# Patient Record
Sex: Male | Born: 1955 | Race: White | Hispanic: No | Marital: Married | Smoking: Never smoker
Health system: Southern US, Community
[De-identification: ages and names within clinical notes are randomized; demographics above are authoritative.]

## PROBLEM LIST (undated history)

## (undated) DIAGNOSIS — K219 Gastro-esophageal reflux disease without esophagitis: Secondary | ICD-10-CM

## (undated) DIAGNOSIS — R112 Nausea with vomiting, unspecified: Secondary | ICD-10-CM

## (undated) DIAGNOSIS — Z87442 Personal history of urinary calculi: Secondary | ICD-10-CM

## (undated) DIAGNOSIS — Z86711 Personal history of pulmonary embolism: Secondary | ICD-10-CM

## (undated) DIAGNOSIS — J111 Influenza due to unidentified influenza virus with other respiratory manifestations: Secondary | ICD-10-CM

## (undated) DIAGNOSIS — Z9889 Other specified postprocedural states: Secondary | ICD-10-CM

## (undated) DIAGNOSIS — M1990 Unspecified osteoarthritis, unspecified site: Secondary | ICD-10-CM

## (undated) HISTORY — PX: BACK SURGERY: SHX140

## (undated) HISTORY — PX: JOINT REPLACEMENT: SHX530

## (undated) HISTORY — PX: OTHER SURGICAL HISTORY: SHX169

## (undated) HISTORY — PX: FRACTURE SURGERY: SHX138

## (undated) HISTORY — PX: EYE SURGERY: SHX253

## (undated) HISTORY — PX: NECK SURGERY: SHX720

## (undated) HISTORY — PX: VASECTOMY: SHX75

---

## 2017-11-04 DIAGNOSIS — I2699 Other pulmonary embolism without acute cor pulmonale: Secondary | ICD-10-CM

## 2017-11-04 HISTORY — DX: Other pulmonary embolism without acute cor pulmonale: I26.99

## 2018-06-22 ENCOUNTER — Other Ambulatory Visit: Payer: Self-pay | Admitting: Orthopedic Surgery

## 2018-06-27 ENCOUNTER — Other Ambulatory Visit: Payer: Self-pay | Admitting: Orthopedic Surgery

## 2018-06-28 NOTE — Patient Instructions (Addendum)
Mark Crawford  06/28/2018   Your procedure is scheduled on: 07-10-18  Report to [facility] Main  Entrance   Report to admitting at     0945  AM    Call this number if you have problems the morning of surgery [phone]    Remember: Do not eat food or drink liquids :After Midnight. BRUSH YOUR TEETH MORNING OF SURGERY AND RINSE YOUR MOUTH OUT, NO CHEWING GUM CANDY OR MINTS.     Take these medicines the morning of surgery with A SIP OF WATER: protonix                                You may not have any metal on your body including hair pins and              piercings  Do not wear jewelry, , lotions, powders or perfumes, deodorant                      Men may shave face and neck.   Do not bring valuables to the hospital. [facility] IS NOT             RESPONSIBLE   FOR VALUABLES.  Contacts, dentures or bridgework may not be worn into surgery.  Leave suitcase in the car. After surgery it may be brought to your room.                Please read over the following fact sheets you were given: _____________________________________________________________________          [facility] - Preparing for Surgery Before surgery, you can play an important role.  Because skin is not sterile, your skin needs to be as free of germs as possible.  You can reduce the number of germs on your skin by washing with CHG (chlorahexidine gluconate) soap before surgery.  CHG is an antiseptic cleaner which kills germs and bonds with the skin to continue killing germs even after washing. Please DO NOT use if you have an allergy to CHG or antibacterial soaps.  If your skin becomes reddened/irritated stop using the CHG and inform your nurse when you arrive at Short Stay. Do not shave (including legs and underarms) for at least 48 hours prior to the first CHG shower.  You may shave your face/neck. Please follow these instructions carefully:  1.  Shower with CHG Soap the night before surgery  and the  morning of Surgery.  2.  If you choose to wash your hair, wash your hair first as usual with your  normal  shampoo.  3.  After you shampoo, rinse your hair and body thoroughly to remove the  shampoo.                           4.  Use CHG as you would any other liquid soap.  You can apply chg directly  to the skin and wash                       Gently with a scrungie or clean washcloth.  5.  Apply the CHG Soap to your body ONLY FROM THE NECK DOWN.   Do not use on face/ open  Wound or open sores. Avoid contact with eyes, ears mouth and genitals (private parts).                       Wash face,  Genitals (private parts) with your normal soap.             6.  Wash thoroughly, paying special attention to the area where your surgery  will be performed.  7.  Thoroughly rinse your body with warm water from the neck down.  8.  DO NOT shower/wash with your normal soap after using and rinsing off  the CHG Soap.                9.  Pat yourself dry with a clean towel.            10.  Wear clean pajamas.            11.  Place clean sheets on your bed the night of your first shower and do not  sleep with pets. Day of Surgery : Do not apply any lotions/deodorants the morning of surgery.  Please wear clean clothes to the hospital/surgery center.  FAILURE TO FOLLOW THESE INSTRUCTIONS MAY RESULT IN THE CANCELLATION OF YOUR SURGERY PATIENT SIGNATURE_________________________________  NURSE SIGNATURE__________________________________  ________________________________________________________________________  WHAT IS A BLOOD TRANSFUSION? Blood Transfusion Information  A transfusion is the replacement of blood or some of its parts. Blood is made up of multiple cells which provide different functions.  Red blood cells carry oxygen and are used for blood loss replacement.  White blood cells fight against infection.  Platelets control bleeding.  Plasma helps clot  blood.  Other blood products are available for specialized needs, such as hemophilia or other clotting disorders. BEFORE THE TRANSFUSION  Who gives blood for transfusions?   Healthy volunteers who are fully evaluated to make sure their blood is safe. This is blood bank blood. Transfusion therapy is the safest it has ever been in the practice of medicine. Before blood is taken from a donor, a complete history is taken to make sure that person has no history of diseases nor engages in risky social behavior (examples are intravenous drug use or sexual activity with multiple partners). The donor's travel history is screened to minimize risk of transmitting infections, such as malaria. The donated blood is tested for signs of infectious diseases, such as HIV and hepatitis. The blood is then tested to be sure it is compatible with you in order to minimize the chance of a transfusion reaction. If you or a relative donates blood, this is often done in anticipation of surgery and is not appropriate for emergency situations. It takes many days to process the donated blood. RISKS AND COMPLICATIONS Although transfusion therapy is very safe and saves many lives, the main dangers of transfusion include:   Getting an infectious disease.  Developing a transfusion reaction. This is an allergic reaction to something in the blood you were given. Every precaution is taken to prevent this. The decision to have a blood transfusion has been considered carefully by your caregiver before blood is given. Blood is not given unless the benefits outweigh the risks. AFTER THE TRANSFUSION  Right after receiving a blood transfusion, you will usually feel much better and more energetic. This is especially true if your red blood cells have gotten low (anemic). The transfusion raises the level of the red blood cells which carry oxygen, and this usually causes an energy increase.  The  nurse administering the transfusion will monitor  you carefully for complications. HOME CARE INSTRUCTIONS  No special instructions are needed after a transfusion. You may find your energy is better. Speak with your caregiver about any limitations on activity for underlying diseases you may have. SEEK MEDICAL CARE IF:   Your condition is not improving after your transfusion.  You develop redness or irritation at the intravenous (IV) site. SEEK IMMEDIATE MEDICAL CARE IF:  Any of the following symptoms occur over the next 12 hours:  Shaking chills.  You have a temperature by mouth above 102 F (38.9 C), not controlled by medicine.  Chest, back, or muscle pain.  People around you feel you are not acting correctly or are confused.  Shortness of breath or difficulty breathing.  Dizziness and fainting.  You get a rash or develop hives.  You have a decrease in urine output.  Your urine turns a dark color or changes to pink, red, or brown. Any of the following symptoms occur over the next 10 days:  You have a temperature by mouth above 102 F (38.9 C), not controlled by medicine.  Shortness of breath.  Weakness after normal activity.  The white part of the eye turns yellow (jaundice).  You have a decrease in the amount of urine or are urinating less often.  Your urine turns a dark color or changes to pink, red, or brown. Document Released: 09/17/2000 Document Revised: 12/13/2011 Document Reviewed: 05/06/2008 ExitCare Patient Information 2014 [place].  _______________________________________________________________________  Incentive Spirometer  An incentive spirometer is a tool that can help keep your lungs clear and active. This tool measures how well you are filling your lungs with each breath. Taking long deep breaths may help reverse or decrease the chance of developing breathing (pulmonary) problems (especially infection) following:  A long period of time when you are unable to move or be active. BEFORE THE  PROCEDURE   If the spirometer includes an indicator to show your best effort, your nurse or respiratory therapist will set it to a desired goal.  If possible, sit up straight or lean slightly forward. Try not to slouch.  Hold the incentive spirometer in an upright position. INSTRUCTIONS FOR USE  1. Sit on the edge of your bed if possible, or sit up as far as you can in bed or on a chair. 2. Hold the incentive spirometer in an upright position. 3. Breathe out normally. 4. Place the mouthpiece in your mouth and seal your lips tightly around it. 5. Breathe in slowly and as deeply as possible, raising the piston or the ball toward the top of the column. 6. Hold your breath for 3-5 seconds or for as long as possible. Allow the piston or ball to fall to the bottom of the column. 7. Remove the mouthpiece from your mouth and breathe out normally. 8. Rest for a few seconds and repeat Steps 1 through 7 at least 10 times every 1-2 hours when you are awake. Take your time and take a few normal breaths between deep breaths. 9. The spirometer may include an indicator to show your best effort. Use the indicator as a goal to work toward during each repetition. 10. After each set of 10 deep breaths, practice coughing to be sure your lungs are clear. If you have an incision (the cut made at the time of surgery), support your incision when coughing by placing a pillow or rolled up towels firmly against it. Once you are able to get  out of bed, walk around indoors and cough well. You may stop using the incentive spirometer when instructed by your caregiver.  RISKS AND COMPLICATIONS  Take your time so you do not get dizzy or light-headed.  If you are in pain, you may need to take or ask for pain medication before doing incentive spirometry. It is harder to take a deep breath if you are having pain. AFTER USE  Rest and breathe slowly and easily.  It can be helpful to keep track of a log of your progress. Your  caregiver can provide you with a simple table to help with this. If you are using the spirometer at home, follow these instructions: [place] IF:   You are having difficultly using the spirometer.  You have trouble using the spirometer as often as instructed.  Your pain medication is not giving enough relief while using the spirometer.  You develop fever of 100.5 F (38.1 C) or higher. SEEK IMMEDIATE MEDICAL CARE IF:   You cough up bloody sputum that had not been present before.  You develop fever of 102 F (38.9 C) or greater.  You develop worsening pain at or near the incision site. MAKE SURE YOU:   Understand these instructions.  Will watch your condition.  Will get help right away if you are not doing well or get worse. Document Released: 01/31/2007 Document Revised: 12/13/2011 Document Reviewed: 04/03/2007 [employer] Patient Information 2014 [place], [place].   ________________________________________________________________________

## 2018-07-04 ENCOUNTER — Other Ambulatory Visit: Payer: Self-pay | Admitting: Orthopedic Surgery

## 2018-07-04 ENCOUNTER — Other Ambulatory Visit: Payer: Self-pay

## 2018-07-04 ENCOUNTER — Ambulatory Visit (HOSPITAL_COMMUNITY)
Admission: RE | Admit: 2018-07-04 | Discharge: 2018-07-04 | Disposition: A | Discharge status: Home / Self Care | Payer: Managed Care, Other (non HMO) | Source: Ambulatory Visit | Attending: Orthopedic Surgery | Admitting: Orthopedic Surgery

## 2018-07-04 ENCOUNTER — Encounter (HOSPITAL_COMMUNITY): Payer: Self-pay

## 2018-07-04 ENCOUNTER — Encounter (HOSPITAL_COMMUNITY)
Admission: RE | Admit: 2018-07-04 | Discharge: 2018-07-04 | Disposition: A | Discharge status: Home / Self Care | Payer: Managed Care, Other (non HMO) | Source: Ambulatory Visit | Attending: Orthopedic Surgery | Admitting: Orthopedic Surgery

## 2018-07-04 DIAGNOSIS — Z01818 Encounter for other preprocedural examination: Secondary | ICD-10-CM | POA: Insufficient documentation

## 2018-07-04 HISTORY — DX: Personal history of pulmonary embolism: Z86.711

## 2018-07-04 HISTORY — DX: Gastro-esophageal reflux disease without esophagitis: K21.9

## 2018-07-04 HISTORY — DX: Influenza due to unidentified influenza virus with other respiratory manifestations: J11.1

## 2018-07-04 HISTORY — DX: Unspecified osteoarthritis, unspecified site: M19.90

## 2018-07-04 HISTORY — DX: Personal history of urinary calculi: Z87.442

## 2018-07-04 LAB — CBC WITH DIFFERENTIAL/PLATELET
Basophils Absolute: 0 10*3/uL (ref 0.0–0.1)
Basophils Relative: 0 %
EOS ABS: 0.1 10*3/uL (ref 0.0–0.7)
Eosinophils Relative: 1 %
HCT: 45.6 % (ref 39.0–52.0)
HEMOGLOBIN: 14.9 g/dL (ref 13.0–17.0)
LYMPHS ABS: 1.3 10*3/uL (ref 0.7–4.0)
Lymphocytes Relative: 22 %
MCH: 30.2 pg (ref 26.0–34.0)
MCHC: 32.7 g/dL (ref 30.0–36.0)
MCV: 92.3 fL (ref 78.0–100.0)
Monocytes Absolute: 0.5 10*3/uL (ref 0.1–1.0)
Monocytes Relative: 9 %
NEUTROS ABS: 3.9 10*3/uL (ref 1.7–7.7)
NEUTROS PCT: 68 %
Platelets: 231 10*3/uL (ref 150–400)
RBC: 4.94 MIL/uL (ref 4.22–5.81)
RDW: 12.9 % (ref 11.5–15.5)
WBC: 5.7 10*3/uL (ref 4.0–10.5)

## 2018-07-04 LAB — BASIC METABOLIC PANEL
ANION GAP: 8 (ref 5–15)
BUN: 20 mg/dL (ref 8–23)
CHLORIDE: 107 mmol/L (ref 98–111)
CO2: 26 mmol/L (ref 22–32)
CREATININE: 1.19 mg/dL (ref 0.61–1.24)
Calcium: 9.7 mg/dL (ref 8.9–10.3)
GFR calc non Af Amer: >60 mL/min (ref >60)
Glucose, Bld: 105 mg/dL — ABNORMAL HIGH (ref 70–99)
Potassium: 4.7 mmol/L (ref 3.5–5.1)
SODIUM: 141 mmol/L (ref 135–145)

## 2018-07-04 LAB — URINALYSIS, ROUTINE W REFLEX MICROSCOPIC
Bilirubin Urine: NEGATIVE
Glucose, UA: NEGATIVE mg/dL
HGB URINE DIPSTICK: NEGATIVE
KETONES UR: NEGATIVE mg/dL
Leukocytes, UA: NEGATIVE
Nitrite: NEGATIVE
PH: 5 (ref 5.0–8.0)
Protein, ur: NEGATIVE mg/dL
Specific Gravity, Urine: 1.009 (ref 1.005–1.030)

## 2018-07-04 LAB — PROTIME-INR
INR: 0.9
PROTHROMBIN TIME: 12.1 s (ref 11.4–15.2)

## 2018-07-04 LAB — SURGICAL PCR SCREEN
MRSA, PCR: NEGATIVE
Staphylococcus aureus: NEGATIVE

## 2018-07-04 LAB — ABO/RH: ABO/RH(D): A POS

## 2018-07-04 LAB — APTT: aPTT: 36 seconds (ref 24–36)

## 2018-07-07 DIAGNOSIS — M87059 Idiopathic aseptic necrosis of unspecified femur: Secondary | ICD-10-CM | POA: Diagnosis present

## 2018-07-07 DIAGNOSIS — S83242A Other tear of medial meniscus, current injury, left knee, initial encounter: Secondary | ICD-10-CM | POA: Diagnosis present

## 2018-07-07 NOTE — H&P (Signed)
TOTAL HIP ADMISSION H&P  Patient is admitted for right total hip arthroplasty and left knee arthroscopy  Subjective:  Chief Complaint: right hip pain and left knee pain  HPI: Mark Crawford, 62 y.o. male, has a history of pain and functional disability in the right hip(s) due to arthritis/ AVN and patient has failed non-surgical conservative treatments for greater than 12 weeks to include NSAID's and/or analgesics, corticosteriod injections, use of assistive devices and activity modification.  Onset of symptoms was gradual starting 1 years ago with rapidlly worsening course since that time.The patient noted no past surgery on the right hip(s).  Patient currently rates pain in the right hip at 10 out of 10 with activity. Patient has night pain, worsening of pain with activity and weight bearing, pain that interfers with activities of daily living and pain with passive range of motion. Patient has evidence of joint space narrowing and AVN by imaging studies. This condition presents safety issues increasing the risk of falls.    There is no current active infection.  There are no active problems to display for this patient.  Past Medical History:  Diagnosis Date  . Arthritis   . Flu    11-2017  . GERD (gastroesophageal reflux disease)   . History of kidney stones   . History of pulmonary embolus (PE)    bil lungs 2-19      No current facility-administered medications for this encounter.    Current Outpatient Medications  Medication Sig Dispense Refill Last Dose  . cetirizine (ZYRTEC) 10 MG tablet Take 10 mg by mouth every evening.     . Melatonin 3 MG TABS Take 6 mg by mouth at bedtime.     . Multiple Vitamin (MULTIVITAMIN WITH MINERALS) TABS tablet Take 1 tablet by mouth daily.     . pantoprazole (PROTONIX) 40 MG tablet Take 40 mg by mouth daily.     Marland Kitchen pyridOXINE (VITAMIN B-6) 100 MG tablet Take 100 mg by mouth daily.     . ranitidine (ZANTAC) 150 MG capsule Take 150 mg by mouth every  evening.     . traZODone (DESYREL) 100 MG tablet Take 100 mg by mouth at bedtime.      No Known Allergies  Social History   Tobacco Use  . Smoking status: Never Smoker  . Smokeless tobacco: Never Used  Substance Use Topics  . Alcohol use: Never    Frequency: Never    No family history on file.   Review of Systems  Constitutional: Negative.   HENT: Negative.   Eyes: Positive for blurred vision.  Respiratory: Negative.   Cardiovascular: Negative.   Gastrointestinal: Negative.   Genitourinary:       Kidney stones  Musculoskeletal: Positive for joint pain and myalgias.  Skin: Negative.   Neurological: Negative.   Endo/Heme/Allergies:       Hx of blood clots  Psychiatric/Behavioral: Negative.     Objective:  Physical Exam  Constitutional: He is oriented to person, place, and time. He appears well-developed and well-nourished.  HENT:  Head: Normocephalic and atraumatic.  Eyes: Pupils are equal, round, and reactive to light.  Neck: Normal range of motion. Neck supple.  Cardiovascular: Intact distal pulses.  Respiratory: Effort normal.  Musculoskeletal: He exhibits tenderness.  The patient's right hip continues to be irritable with internal rotation.  Patient's left knee does have tenderness over the medial joint line.  Increased pain with flexion and McMurray's test.  No noticeable effusion.  No erythema or warmth.  Calves are soft and nontender.  Neurological: He is alert and oriented to person, place, and time.  Skin: Skin is warm and dry.  Psychiatric: He has a normal mood and affect. His behavior is normal. Judgment and thought content normal.    Vital signs in last 24 hours:    Labs:   Estimated body mass index is 30.08 kg/m as calculated from the following:   Height as of 07/04/18: 6\' 1"  (1.854 m).   Weight as of 07/04/18: 103.4 kg.   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    Patient's left  knee MRI does show a large medial meniscal tear.  He also has chondromalacia.   Preoperative templating of the joint replacement has been completed, documented, and submitted to the Operating Room personnel in order to optimize intra-operative equipment management.     Assessment/Plan:  End stage arthritis/ AVN, right hip(s)  And left knee medial meniscus tear  The patient history, physical examination, clinical judgement of the provider and imaging studies are consistent with end stage degenerative joint disease of the right hip(s) and total hip arthroplasty is deemed medically necessary. The treatment options including medical management, injection therapy, arthroscopy and arthroplasty were discussed at length. The risks and benefits of total hip arthroplasty were presented and reviewed. The risks due to aseptic loosening, infection, stiffness, dislocation/subluxation,  thromboembolic complications and other imponderables were discussed.  The patient acknowledged the explanation, agreed to proceed with the plan and consent was signed. Patient is being admitted for inpatient treatment for surgery, pain control, PT, OT, prophylactic antibiotics, VTE prophylaxis, progressive ambulation and ADL's and discharge planning.The patient is planning to be discharged home with home health services

## 2018-07-09 MED ORDER — TRANEXAMIC ACID 1000 MG/10ML IV SOLN
2000.0000 mg | INTRAVENOUS | Status: DC
  Filled 2018-07-09: qty 20

## 2018-07-09 MED ORDER — BUPIVACAINE LIPOSOME 1.3 % IJ SUSP
20.0000 mL | Freq: Once | INTRAMUSCULAR | Status: DC
  Filled 2018-07-09: qty 20

## 2018-07-10 ENCOUNTER — Inpatient Hospital Stay (HOSPITAL_COMMUNITY): Payer: Managed Care, Other (non HMO)

## 2018-07-10 ENCOUNTER — Encounter (HOSPITAL_COMMUNITY)
Admission: RE | Disposition: A | Discharge status: Home Health | Payer: Self-pay | Source: Ambulatory Visit | Attending: Orthopedic Surgery

## 2018-07-10 ENCOUNTER — Inpatient Hospital Stay (HOSPITAL_COMMUNITY): Payer: Managed Care, Other (non HMO) | Admitting: Anesthesiology

## 2018-07-10 ENCOUNTER — Other Ambulatory Visit: Payer: Self-pay

## 2018-07-10 ENCOUNTER — Encounter (HOSPITAL_COMMUNITY): Payer: Self-pay

## 2018-07-10 ENCOUNTER — Inpatient Hospital Stay (HOSPITAL_COMMUNITY)
Admission: RE | Admit: 2018-07-10 | Discharge: 2018-07-11 | DRG: 470 | Disposition: A | Discharge status: Home Health | Payer: Managed Care, Other (non HMO) | Source: Ambulatory Visit | Attending: Orthopedic Surgery | Admitting: Orthopedic Surgery

## 2018-07-10 DIAGNOSIS — K219 Gastro-esophageal reflux disease without esophagitis: Secondary | ICD-10-CM | POA: Diagnosis present

## 2018-07-10 DIAGNOSIS — M1611 Unilateral primary osteoarthritis, right hip: Secondary | ICD-10-CM | POA: Diagnosis present

## 2018-07-10 DIAGNOSIS — S83242A Other tear of medial meniscus, current injury, left knee, initial encounter: Secondary | ICD-10-CM | POA: Diagnosis present

## 2018-07-10 DIAGNOSIS — Z79899 Other long term (current) drug therapy: Secondary | ICD-10-CM

## 2018-07-10 DIAGNOSIS — M87851 Other osteonecrosis, right femur: Principal | ICD-10-CM | POA: Diagnosis present

## 2018-07-10 DIAGNOSIS — D62 Acute posthemorrhagic anemia: Secondary | ICD-10-CM | POA: Diagnosis not present

## 2018-07-10 DIAGNOSIS — Z86711 Personal history of pulmonary embolism: Secondary | ICD-10-CM | POA: Diagnosis not present

## 2018-07-10 DIAGNOSIS — Z86718 Personal history of other venous thrombosis and embolism: Secondary | ICD-10-CM | POA: Diagnosis not present

## 2018-07-10 DIAGNOSIS — Z419 Encounter for procedure for purposes other than remedying health state, unspecified: Secondary | ICD-10-CM

## 2018-07-10 DIAGNOSIS — M87059 Idiopathic aseptic necrosis of unspecified femur: Secondary | ICD-10-CM | POA: Diagnosis present

## 2018-07-10 HISTORY — PX: TOTAL HIP ARTHROPLASTY: SHX124

## 2018-07-10 HISTORY — PX: KNEE ARTHROSCOPY: SHX127

## 2018-07-10 LAB — TYPE AND SCREEN
ABO/RH(D): A POS
Antibody Screen: NEGATIVE

## 2018-07-10 SURGERY — ARTHROPLASTY, HIP, TOTAL, ANTERIOR APPROACH
Anesthesia: Spinal | Site: Hip | Laterality: Right

## 2018-07-10 MED ORDER — MENTHOL 3 MG MT LOZG: 1.0000 | LOZENGE | OROMUCOSAL | Status: DC | PRN

## 2018-07-10 MED ORDER — DEXAMETHASONE SODIUM PHOSPHATE 10 MG/ML IJ SOLN
INTRAMUSCULAR | Status: AC
  Filled 2018-07-10: qty 1

## 2018-07-10 MED ORDER — GABAPENTIN 300 MG PO CAPS
300.0000 mg | ORAL_CAPSULE | Freq: Three times a day (TID) | ORAL | Status: DC
  Administered 2018-07-10 – 2018-07-11 (×3): 300 mg via ORAL
  Filled 2018-07-10 (×3): qty 1

## 2018-07-10 MED ORDER — TRANEXAMIC ACID 1000 MG/10ML IV SOLN
1000.0000 mg | INTRAVENOUS | Status: AC
  Administered 2018-07-10: 1000 mg via INTRAVENOUS
  Filled 2018-07-10: qty 1000

## 2018-07-10 MED ORDER — FAMOTIDINE 20 MG PO TABS
20.0000 mg | ORAL_TABLET | Freq: Two times a day (BID) | ORAL | Status: DC
  Administered 2018-07-10 – 2018-07-11 (×2): 20 mg via ORAL
  Filled 2018-07-10 (×2): qty 1

## 2018-07-10 MED ORDER — BUPIVACAINE HCL (PF) 0.5 % IJ SOLN
INTRAMUSCULAR | Status: AC
  Filled 2018-07-10: qty 30

## 2018-07-10 MED ORDER — TRANEXAMIC ACID 1000 MG/10ML IV SOLN
INTRAVENOUS | Status: DC | PRN
  Administered 2018-07-10: 2000 mg via TOPICAL

## 2018-07-10 MED ORDER — CEFAZOLIN SODIUM-DEXTROSE 2-4 GM/100ML-% IV SOLN
2.0000 g | INTRAVENOUS | Status: AC
  Administered 2018-07-10: 2 g via INTRAVENOUS
  Filled 2018-07-10: qty 100

## 2018-07-10 MED ORDER — ACETAMINOPHEN 500 MG PO TABS
1000.0000 mg | ORAL_TABLET | Freq: Four times a day (QID) | ORAL | Status: AC
  Administered 2018-07-10 – 2018-07-11 (×4): 1000 mg via ORAL
  Filled 2018-07-10 (×4): qty 2

## 2018-07-10 MED ORDER — ACETAMINOPHEN 325 MG PO TABS: 325.0000 mg | ORAL_TABLET | Freq: Four times a day (QID) | ORAL | Status: DC | PRN

## 2018-07-10 MED ORDER — METOCLOPRAMIDE HCL 5 MG PO TABS: 5.0000 mg | ORAL_TABLET | Freq: Three times a day (TID) | ORAL | Status: DC | PRN

## 2018-07-10 MED ORDER — HYDROMORPHONE HCL 1 MG/ML IJ SOLN
0.2500 mg | INTRAMUSCULAR | Status: DC | PRN
  Administered 2018-07-10 (×2): 0.25 mg via INTRAVENOUS

## 2018-07-10 MED ORDER — DEXAMETHASONE SODIUM PHOSPHATE 10 MG/ML IJ SOLN
INTRAMUSCULAR | Status: DC | PRN
  Administered 2018-07-10: 10 mg via INTRAVENOUS

## 2018-07-10 MED ORDER — MIDAZOLAM HCL 2 MG/2ML IJ SOLN
INTRAMUSCULAR | Status: AC
  Filled 2018-07-10: qty 2

## 2018-07-10 MED ORDER — ALUM & MAG HYDROXIDE-SIMETH 200-200-20 MG/5ML PO SUSP: 30.0000 mL | ORAL | Status: DC | PRN

## 2018-07-10 MED ORDER — DOCUSATE SODIUM 100 MG PO CAPS
100.0000 mg | ORAL_CAPSULE | Freq: Two times a day (BID) | ORAL | Status: DC
  Administered 2018-07-10 – 2018-07-11 (×2): 100 mg via ORAL
  Filled 2018-07-10 (×2): qty 1

## 2018-07-10 MED ORDER — LACTATED RINGERS IV SOLN
INTRAVENOUS | Status: DC
  Administered 2018-07-10: 1000 mL via INTRAVENOUS
  Administered 2018-07-10: 13:00:00 via INTRAVENOUS

## 2018-07-10 MED ORDER — ONDANSETRON HCL 4 MG/2ML IJ SOLN
INTRAMUSCULAR | Status: AC
  Filled 2018-07-10: qty 2

## 2018-07-10 MED ORDER — LIDOCAINE HCL (CARDIAC) PF 100 MG/5ML IV SOSY
PREFILLED_SYRINGE | INTRAVENOUS | Status: AC
  Filled 2018-07-10: qty 5

## 2018-07-10 MED ORDER — PHENOL 1.4 % MT LIQD
1.0000 | OROMUCOSAL | Status: DC | PRN
  Filled 2018-07-10: qty 177

## 2018-07-10 MED ORDER — TRANEXAMIC ACID 1000 MG/10ML IV SOLN: 1000.0000 mg | Freq: Once | INTRAVENOUS | Status: DC

## 2018-07-10 MED ORDER — POLYETHYLENE GLYCOL 3350 17 G PO PACK: 17.0000 g | PACK | Freq: Every day | ORAL | Status: DC | PRN

## 2018-07-10 MED ORDER — METHOCARBAMOL 500 MG IVPB - SIMPLE MED
INTRAVENOUS | Status: AC
  Filled 2018-07-10: qty 50

## 2018-07-10 MED ORDER — LORATADINE 10 MG PO TABS
10.0000 mg | ORAL_TABLET | Freq: Every day | ORAL | Status: DC
  Administered 2018-07-10 – 2018-07-11 (×2): 10 mg via ORAL
  Filled 2018-07-10 (×2): qty 1

## 2018-07-10 MED ORDER — TRAZODONE HCL 100 MG PO TABS
100.0000 mg | ORAL_TABLET | Freq: Every day | ORAL | Status: DC
  Administered 2018-07-10: 100 mg via ORAL
  Filled 2018-07-10: qty 1

## 2018-07-10 MED ORDER — PROMETHAZINE HCL 25 MG/ML IJ SOLN: 6.2500 mg | INTRAMUSCULAR | Status: DC | PRN

## 2018-07-10 MED ORDER — FENTANYL CITRATE (PF) 100 MCG/2ML IJ SOLN
INTRAMUSCULAR | Status: AC
  Filled 2018-07-10: qty 2

## 2018-07-10 MED ORDER — APIXABAN 2.5 MG PO TABS: 2.5000 mg | ORAL_TABLET | Freq: Two times a day (BID) | ORAL | 0 refills | Status: DC

## 2018-07-10 MED ORDER — FENTANYL CITRATE (PF) 100 MCG/2ML IJ SOLN
INTRAMUSCULAR | Status: DC | PRN
  Administered 2018-07-10: 100 ug via INTRAVENOUS

## 2018-07-10 MED ORDER — TRANEXAMIC ACID 1000 MG/10ML IV SOLN
1000.0000 mg | INTRAVENOUS | Status: AC
  Administered 2018-07-10: 1000 mg via INTRAVENOUS
  Filled 2018-07-10: qty 10

## 2018-07-10 MED ORDER — BUPIVACAINE HCL (PF) 0.25 % IJ SOLN
INTRAMUSCULAR | Status: AC
  Filled 2018-07-10: qty 30

## 2018-07-10 MED ORDER — DEXAMETHASONE SODIUM PHOSPHATE 10 MG/ML IJ SOLN
10.0000 mg | Freq: Once | INTRAMUSCULAR | Status: AC
  Administered 2018-07-11: 10 mg via INTRAVENOUS
  Filled 2018-07-10: qty 1

## 2018-07-10 MED ORDER — CELECOXIB 200 MG PO CAPS
200.0000 mg | ORAL_CAPSULE | Freq: Two times a day (BID) | ORAL | Status: DC
  Administered 2018-07-10 – 2018-07-11 (×2): 200 mg via ORAL
  Filled 2018-07-10 (×2): qty 1

## 2018-07-10 MED ORDER — MEPERIDINE HCL 50 MG/ML IJ SOLN: 6.2500 mg | INTRAMUSCULAR | Status: DC | PRN

## 2018-07-10 MED ORDER — LACTATED RINGERS IR SOLN
Status: DC | PRN
  Administered 2018-07-10: 6000 mL

## 2018-07-10 MED ORDER — TRANEXAMIC ACID 1000 MG/10ML IV SOLN
1000.0000 mg | Freq: Once | INTRAVENOUS | Status: DC
  Filled 2018-07-10: qty 10

## 2018-07-10 MED ORDER — ONDANSETRON HCL 4 MG PO TABS: 4.0000 mg | ORAL_TABLET | Freq: Four times a day (QID) | ORAL | Status: DC | PRN

## 2018-07-10 MED ORDER — SODIUM CHLORIDE 0.9% FLUSH
INTRAVENOUS | Status: DC | PRN
  Administered 2018-07-10: 50 mL

## 2018-07-10 MED ORDER — PANTOPRAZOLE SODIUM 40 MG PO TBEC
40.0000 mg | DELAYED_RELEASE_TABLET | Freq: Every day | ORAL | Status: DC
  Administered 2018-07-10 – 2018-07-11 (×2): 40 mg via ORAL
  Filled 2018-07-10 (×2): qty 1

## 2018-07-10 MED ORDER — KCL IN DEXTROSE-NACL 20-5-0.45 MEQ/L-%-% IV SOLN
INTRAVENOUS | Status: DC
  Administered 2018-07-10 – 2018-07-11 (×2): via INTRAVENOUS
  Filled 2018-07-10 (×3): qty 1000

## 2018-07-10 MED ORDER — BUPIVACAINE HCL (PF) 0.5 % IJ SOLN
INTRAMUSCULAR | Status: DC | PRN
  Administered 2018-07-10: 3 mL via INTRATHECAL

## 2018-07-10 MED ORDER — TIZANIDINE HCL 2 MG PO TABS: 2.0000 mg | ORAL_TABLET | Freq: Four times a day (QID) | ORAL | 0 refills | Status: DC | PRN

## 2018-07-10 MED ORDER — METOCLOPRAMIDE HCL 5 MG/ML IJ SOLN: 5.0000 mg | Freq: Three times a day (TID) | INTRAMUSCULAR | Status: DC | PRN

## 2018-07-10 MED ORDER — MIDAZOLAM HCL 2 MG/2ML IJ SOLN
INTRAMUSCULAR | Status: DC | PRN
  Administered 2018-07-10 (×2): 0.5 mg via INTRAVENOUS
  Administered 2018-07-10: 2 mg via INTRAVENOUS
  Administered 2018-07-10 (×2): 0.5 mg via INTRAVENOUS

## 2018-07-10 MED ORDER — PROPOFOL 500 MG/50ML IV EMUL
INTRAVENOUS | Status: DC | PRN
  Administered 2018-07-10: 80 ug/kg/min via INTRAVENOUS

## 2018-07-10 MED ORDER — APIXABAN 2.5 MG PO TABS
2.5000 mg | ORAL_TABLET | Freq: Two times a day (BID) | ORAL | Status: DC
  Administered 2018-07-11: 2.5 mg via ORAL
  Filled 2018-07-10: qty 1

## 2018-07-10 MED ORDER — PROPOFOL 10 MG/ML IV BOLUS
INTRAVENOUS | Status: AC
  Filled 2018-07-10: qty 20

## 2018-07-10 MED ORDER — PROPOFOL 10 MG/ML IV BOLUS
INTRAVENOUS | Status: AC
  Filled 2018-07-10: qty 60

## 2018-07-10 MED ORDER — ONDANSETRON HCL 4 MG/2ML IJ SOLN: 4.0000 mg | Freq: Four times a day (QID) | INTRAMUSCULAR | Status: DC | PRN

## 2018-07-10 MED ORDER — BUPIVACAINE LIPOSOME 1.3 % IJ SUSP
INTRAMUSCULAR | Status: DC | PRN
  Administered 2018-07-10: 20 mL
  Administered 2018-07-10: 15 mL

## 2018-07-10 MED ORDER — BISACODYL 5 MG PO TBEC: 5.0000 mg | DELAYED_RELEASE_TABLET | Freq: Every day | ORAL | Status: DC | PRN

## 2018-07-10 MED ORDER — SODIUM CHLORIDE 0.9 % IJ SOLN
INTRAMUSCULAR | Status: AC
  Filled 2018-07-10: qty 50

## 2018-07-10 MED ORDER — HYDROMORPHONE HCL 1 MG/ML IJ SOLN: 0.5000 mg | INTRAMUSCULAR | Status: DC | PRN

## 2018-07-10 MED ORDER — HYDROMORPHONE HCL 1 MG/ML IJ SOLN
INTRAMUSCULAR | Status: AC
  Administered 2018-07-10: 0.25 mg via INTRAVENOUS
  Filled 2018-07-10: qty 1

## 2018-07-10 MED ORDER — LACTATED RINGERS IV SOLN: INTRAVENOUS | Status: DC

## 2018-07-10 MED ORDER — 0.9 % SODIUM CHLORIDE (POUR BTL) OPTIME
TOPICAL | Status: DC | PRN
  Administered 2018-07-10: 1000 mL

## 2018-07-10 MED ORDER — CHLORHEXIDINE GLUCONATE 4 % EX LIQD: 60.0000 mL | Freq: Once | CUTANEOUS | Status: DC

## 2018-07-10 MED ORDER — ONDANSETRON HCL 4 MG/2ML IJ SOLN
INTRAMUSCULAR | Status: DC | PRN
  Administered 2018-07-10: 4 mg via INTRAVENOUS

## 2018-07-10 MED ORDER — DIPHENHYDRAMINE HCL 12.5 MG/5ML PO ELIX: 12.5000 mg | ORAL_SOLUTION | ORAL | Status: DC | PRN

## 2018-07-10 MED ORDER — OXYCODONE-ACETAMINOPHEN 5-325 MG PO TABS: 1.0000 | ORAL_TABLET | ORAL | 0 refills | Status: DC | PRN

## 2018-07-10 MED ORDER — FLEET ENEMA 7-19 GM/118ML RE ENEM: 1.0000 | ENEMA | Freq: Once | RECTAL | Status: DC | PRN

## 2018-07-10 MED ORDER — METHOCARBAMOL 500 MG PO TABS
500.0000 mg | ORAL_TABLET | Freq: Four times a day (QID) | ORAL | Status: DC | PRN
  Administered 2018-07-10: 500 mg via ORAL
  Filled 2018-07-10: qty 1

## 2018-07-10 MED ORDER — OXYCODONE HCL 5 MG PO TABS
5.0000 mg | ORAL_TABLET | ORAL | Status: DC | PRN
  Administered 2018-07-10: 10 mg via ORAL
  Administered 2018-07-10 (×2): 5 mg via ORAL
  Administered 2018-07-11 (×3): 10 mg via ORAL
  Filled 2018-07-10 (×2): qty 1
  Filled 2018-07-10 (×4): qty 2

## 2018-07-10 MED ORDER — LIDOCAINE 2% (20 MG/ML) 5 ML SYRINGE
INTRAMUSCULAR | Status: DC | PRN
  Administered 2018-07-10: 50 mg via INTRAVENOUS

## 2018-07-10 MED ORDER — METHOCARBAMOL 500 MG IVPB - SIMPLE MED
500.0000 mg | Freq: Four times a day (QID) | INTRAVENOUS | Status: DC | PRN
  Administered 2018-07-10: 500 mg via INTRAVENOUS
  Filled 2018-07-10: qty 50

## 2018-07-10 SURGICAL SUPPLY — 74 items
BAG DECANTER FOR FLEXI CONT (MISCELLANEOUS) ×3 IMPLANT
BAG ZIPLOCK 12X15 (MISCELLANEOUS) ×3 IMPLANT
BANDAGE ACE 6X5 VEL STRL LF (GAUZE/BANDAGES/DRESSINGS) ×6 IMPLANT
BLADE CUTTER GATOR 3.5 (BLADE) ×3 IMPLANT
BLADE GREAT WHITE 4.2 (BLADE) ×3 IMPLANT
BLADE SAW SGTL 18X1.27X75 (BLADE) ×3 IMPLANT
BLADE SURG SZ11 CARB STEEL (BLADE) IMPLANT
BUR OVAL 6.0 (BURR) ×3 IMPLANT
CABLE CERLAGE W/CRIMP 1.8 (Cable) ×3 IMPLANT
COVER PERINEAL POST (MISCELLANEOUS) ×3 IMPLANT
COVER SURGICAL LIGHT HANDLE (MISCELLANEOUS) ×3 IMPLANT
CUFF TOURN SGL QUICK 34 (TOURNIQUET CUFF) ×1
CUFF TRNQT CYL 34X4X40X1 (TOURNIQUET CUFF) ×2 IMPLANT
CUP ACETBLR 54 OD 100 SERIES (Hips) ×3 IMPLANT
DECANTER SPIKE VIAL GLASS SM (MISCELLANEOUS) ×6 IMPLANT
DRAPE SHEET LG 3/4 BI-LAMINATE (DRAPES) ×3 IMPLANT
DRAPE STERI IOBAN 125X83 (DRAPES) ×3 IMPLANT
DRAPE U-SHAPE 47X51 STRL (DRAPES) ×6 IMPLANT
DRSG AQUACEL AG ADV 3.5X 6 (GAUZE/BANDAGES/DRESSINGS) ×3 IMPLANT
DRSG AQUACEL AG ADV 3.5X10 (GAUZE/BANDAGES/DRESSINGS) IMPLANT
DRSG PAD ABDOMINAL 8X10 ST (GAUZE/BANDAGES/DRESSINGS) ×3 IMPLANT
DURAPREP 26ML APPLICATOR (WOUND CARE) ×3 IMPLANT
ELECT PENCIL ROCKER SW 15FT (MISCELLANEOUS) ×3 IMPLANT
ELECT REM PT RETURN 15FT ADLT (MISCELLANEOUS) ×3 IMPLANT
ELIMINATOR HOLE APEX DEPUY (Hips) ×3 IMPLANT
GAUZE SPONGE 4X4 12PLY STRL (GAUZE/BANDAGES/DRESSINGS) ×6 IMPLANT
GAUZE XEROFORM 1X8 LF (GAUZE/BANDAGES/DRESSINGS) ×6 IMPLANT
GLOVE BIO SURGEON STRL SZ7 (GLOVE) ×3 IMPLANT
GLOVE BIO SURGEON STRL SZ7.5 (GLOVE) ×3 IMPLANT
GLOVE BIO SURGEON STRL SZ8.5 (GLOVE) ×3 IMPLANT
GLOVE BIOGEL PI IND STRL 7.0 (GLOVE) ×2 IMPLANT
GLOVE BIOGEL PI IND STRL 8 (GLOVE) ×2 IMPLANT
GLOVE BIOGEL PI IND STRL 9 (GLOVE) ×2 IMPLANT
GLOVE BIOGEL PI INDICATOR 7.0 (GLOVE) ×1
GLOVE BIOGEL PI INDICATOR 8 (GLOVE) ×1
GLOVE BIOGEL PI INDICATOR 9 (GLOVE) ×1
GOWN STRL REUS W/ TWL LRG LVL3 (GOWN DISPOSABLE) ×2 IMPLANT
GOWN STRL REUS W/ TWL XL LVL3 (GOWN DISPOSABLE) ×2 IMPLANT
GOWN STRL REUS W/TWL LRG LVL3 (GOWN DISPOSABLE) ×1
GOWN STRL REUS W/TWL XL LVL3 (GOWN DISPOSABLE) ×1
HEAD CERAMIC DELTA 36 PLUS 1.5 (Hips) ×3 IMPLANT
KIT BASIN OR (CUSTOM PROCEDURE TRAY) IMPLANT
LINER NEUTRAL 54X36MM PLUS 4 (Hips) ×3 IMPLANT
MANIFOLD NEPTUNE II (INSTRUMENTS) ×3 IMPLANT
NDL SAFETY ECLIPSE 18X1.5 (NEEDLE) ×2 IMPLANT
NEEDLE HYPO 18GX1.5 SHARP (NEEDLE) ×1
NEEDLE HYPO 22GX1.5 SAFETY (NEEDLE) ×6 IMPLANT
NEEDLE SPNL 18GX3.5 QUINCKE PK (NEEDLE) ×3 IMPLANT
PACK ANTERIOR HIP CUSTOM (KITS) ×3 IMPLANT
PACK ARTHROSCOPY WL (CUSTOM PROCEDURE TRAY) ×3 IMPLANT
PADDING CAST COTTON 6X4 STRL (CAST SUPPLIES) ×3 IMPLANT
PADDING CAST SYN 6 (CAST SUPPLIES) ×1
PADDING CAST SYNTHETIC 6X4 NS (CAST SUPPLIES) ×2 IMPLANT
POSITIONER SURGICAL ARM (MISCELLANEOUS) ×3 IMPLANT
SPONGE LAP 18X18 RF (DISPOSABLE) ×3 IMPLANT
STEM TRI LOC BPS SZ7 W GRIPTON (Hips) ×2 IMPLANT
SUT ETHIBOND NAB CT1 #1 30IN (SUTURE) ×3 IMPLANT
SUT ETHILON 3 0 PS 1 (SUTURE) ×3 IMPLANT
SUT VIC AB 0 CT1 27 (SUTURE)
SUT VIC AB 0 CT1 27XBRD ANBCTR (SUTURE) IMPLANT
SUT VIC AB 1 CTX 36 (SUTURE) ×1
SUT VIC AB 1 CTX36XBRD ANBCTR (SUTURE) ×2 IMPLANT
SUT VIC AB 2-0 CT1 27 (SUTURE) ×1
SUT VIC AB 2-0 CT1 TAPERPNT 27 (SUTURE) ×2 IMPLANT
SUT VIC AB 3-0 CT1 27 (SUTURE) ×1
SUT VIC AB 3-0 CT1 TAPERPNT 27 (SUTURE) ×2 IMPLANT
SYR 20CC LL (SYRINGE) ×3 IMPLANT
SYR CONTROL 10ML LL (SYRINGE) ×9 IMPLANT
TRAY FOLEY CATH 14FR (SET/KITS/TRAYS/PACK) ×3 IMPLANT
TRAY FOLEY MTR SLVR 16FR STAT (SET/KITS/TRAYS/PACK) IMPLANT
TRI LOC BPS SZ 7 W GRIPTON (Hips) ×3 IMPLANT
TUBING ARTHRO INFLOW-ONLY STRL (TUBING) ×3 IMPLANT
WAND 90 DEG TURBOVAC W/CORD (SURGICAL WAND) ×3 IMPLANT
YANKAUER SUCT BULB TIP 10FT TU (MISCELLANEOUS) ×3 IMPLANT

## 2018-07-10 NOTE — Discharge Instructions (Addendum)
INSTRUCTIONS AFTER JOINT REPLACEMENT  ° °o Remove items at home which could result in a fall. This includes throw rugs or furniture in walking pathways °o ICE to the affected joint every three hours while awake for 30 minutes at a time, for at least the first 3-5 days, and then as needed for pain and swelling.  Continue to use ice for pain and swelling. You may notice swelling that will progress down to the foot and ankle.  This is normal after surgery.  Elevate your leg when you are not up walking on it.   °o Continue to use the breathing machine you got in the hospital (incentive spirometer) which will help keep your temperature down.  It is common for your temperature to cycle up and down following surgery, especially at night when you are not up moving around and exerting yourself.  The breathing machine keeps your lungs expanded and your temperature down. ° ° °DIET:  As you were doing prior to hospitalization, we recommend a well-balanced diet. ° °DRESSING / WOUND CARE / SHOWERING ° °Keep the surgical dressing until follow up.  The dressing is water proof, so you can shower without any extra covering.  IF THE DRESSING FALLS OFF or the wound gets wet inside, change the dressing with sterile gauze.  Please use good hand washing techniques before changing the dressing.  Do not use any lotions or creams on the incision until instructed by your surgeon.   ° °ACTIVITY ° °o Increase activity slowly as tolerated, but follow the weight bearing instructions below.   °o No driving for 6 weeks or until further direction given by your physician.  You cannot drive while taking narcotics.  °o No lifting or carrying greater than 10 lbs. until further directed by your surgeon. °o Avoid periods of inactivity such as sitting longer than an hour when not asleep. This helps prevent blood clots.  °o You may return to work once you are authorized by your doctor.  ° ° ° °WEIGHT BEARING  ° °Weight bearing as tolerated with assist  device (walker, cane, etc) as directed, use it as long as suggested by your surgeon or therapist, typically at least 4-6 weeks. ° ° °EXERCISES ° °Results after joint replacement surgery are often greatly improved when you follow the exercise, range of motion and muscle strengthening exercises prescribed by your doctor. Safety measures are also important to protect the joint from further injury. Any time any of these exercises cause you to have increased pain or swelling, decrease what you are doing until you are comfortable again and then slowly increase them. If you have problems or questions, call your caregiver or physical therapist for advice.  ° °Rehabilitation is important following a joint replacement. After just a few days of immobilization, the muscles of the leg can become weakened and shrink (atrophy).  These exercises are designed to build up the tone and strength of the thigh and leg muscles and to improve motion. Often times heat used for twenty to thirty minutes before working out will loosen up your tissues and help with improving the range of motion but do not use heat for the first two weeks following surgery (sometimes heat can increase post-operative swelling).  ° °These exercises can be done on a training (exercise) mat, on the floor, on a table or on a bed. Use whatever works the best and is most comfortable for you.    Use music or television while you are exercising so that   the exercises are a pleasant break in your day. This will make your life better with the exercises acting as a break in your routine that you can look forward to.   Perform all exercises about fifteen times, three times per day or as directed.  You should exercise both the operative leg and the other leg as well. ° °Exercises include: °  °• Quad Sets - Tighten up the muscle on the front of the thigh (Quad) and hold for 5-10 seconds.   °• Straight Leg Raises - With your knee straight (if you were given a brace, keep it on),  lift the leg to 60 degrees, hold for 3 seconds, and slowly lower the leg.  Perform this exercise against resistance later as your leg gets stronger.  °• Leg Slides: Lying on your back, slowly slide your foot toward your buttocks, bending your knee up off the floor (only go as far as is comfortable). Then slowly slide your foot back down until your leg is flat on the floor again.  °• Angel Wings: Lying on your back spread your legs to the side as far apart as you can without causing discomfort.  °• Hamstring Strength:  Lying on your back, push your heel against the floor with your leg straight by tightening up the muscles of your buttocks.  Repeat, but this time bend your knee to a comfortable angle, and push your heel against the floor.  You may put a pillow under the heel to make it more comfortable if necessary.  ° °A rehabilitation program following joint replacement surgery can speed recovery and prevent re-injury in the future due to weakened muscles. Contact your doctor or a physical therapist for more information on knee rehabilitation.  ° ° °CONSTIPATION ° °Constipation is defined medically as fewer than three stools per week and severe constipation as less than one stool per week.  Even if you have a regular bowel pattern at home, your normal regimen is likely to be disrupted due to multiple reasons following surgery.  Combination of anesthesia, postoperative narcotics, change in appetite and fluid intake all can affect your bowels.  ° °YOU MUST use at least one of the following options; they are listed in order of increasing strength to get the job done.  They are all available over the counter, and you may need to use some, POSSIBLY even all of these options:   ° °Drink plenty of fluids (prune juice may be helpful) and high fiber foods °Colace 100 mg by mouth twice a day  °Senokot for constipation as directed and as needed Dulcolax (bisacodyl), take with full glass of water  °Miralax (polyethylene glycol)  once or twice a day as needed. ° °If you have tried all these things and are unable to have a bowel movement in the first 3-4 days after surgery call either your surgeon or your primary doctor.   ° °If you experience loose stools or diarrhea, hold the medications until you stool forms back up.  If your symptoms do not get better within 1 week or if they get worse, check with your doctor.  If you experience "the worst abdominal pain ever" or develop nausea or vomiting, please contact the office immediately for further recommendations for treatment. ° ° °ITCHING:  If you experience itching with your medications, try taking only a single pain pill, or even half a pain pill at a time.  You can also use Benadryl over the counter for itching or also to   help with sleep.  ° °TED HOSE STOCKINGS:  Use stockings on both legs until for at least 2 weeks or as directed by physician office. They may be removed at night for sleeping. ° °MEDICATIONS:  See your medication summary on the “After Visit Summary” that nursing will review with you.  You may have some home medications which will be placed on hold until you complete the course of blood thinner medication.  It is important for you to complete the blood thinner medication as prescribed. ° °PRECAUTIONS:  If you experience chest pain or shortness of breath - call 911 immediately for transfer to the hospital emergency department.  ° °If you develop a fever greater that 101 F, purulent drainage from wound, increased redness or drainage from wound, foul odor from the wound/dressing, or calf pain - CONTACT YOUR SURGEON.   °                                                °FOLLOW-UP APPOINTMENTS:  If you do not already have a post-op appointment, please call the office for an appointment to be seen by your surgeon.  Guidelines for how soon to be seen are listed in your “After Visit Summary”, but are typically between 1-4 weeks after surgery. ° °OTHER INSTRUCTIONS:  ° °Knee  Replacement:  Do not place pillow under knee, focus on keeping the knee straight while resting. CPM instructions: 0-90 degrees, 2 hours in the morning, 2 hours in the afternoon, and 2 hours in the evening. Place foam block, curve side up under heel at all times except when in CPM or when walking.  DO NOT modify, tear, cut, or change the foam block in any way. ° °MAKE SURE YOU:  °• Understand these instructions.  °• Get help right away if you are not doing well or get worse.  ° ° °Thank you for letting us be a part of your medical care team.  It is a privilege we respect greatly.  We hope these instructions will help you stay on track for a fast and full recovery!  ° ° °Information on my medicine - ELIQUIS® (apixaban) ° °Why was Eliquis® prescribed for you? °Eliquis® was prescribed for you to reduce the risk of blood clots forming after orthopedic surgery.   ° °What do You need to know about Eliquis®? °Take your Eliquis® TWICE DAILY - one tablet in the morning and one tablet in the evening with or without food.  It would be best to take the dose about the same time each day. ° °If you have difficulty swallowing the tablet whole please discuss with your pharmacist how to take the medication safely. ° °Take Eliquis® exactly as prescribed by your doctor and DO NOT stop taking Eliquis® without talking to the doctor who prescribed the medication.  Stopping without other medication to take the place of Eliquis® may increase your risk of developing a clot. ° °After discharge, you should have regular check-up appointments with your healthcare provider that is prescribing your Eliquis®. ° °What do you do if you miss a dose? °If a dose of ELIQUIS® is not taken at the scheduled time, take it as soon as possible on the same day and twice-daily administration should be resumed.  The dose should not be doubled to make up for a missed dose.  Do not take more   than one tablet of ELIQUIS at the same time. ° °Important Safety  Information °A possible side effect of Eliquis® is bleeding. You should call your healthcare provider right away if you experience any of the following: °? Bleeding from an injury or your nose that does not stop. °? Unusual colored urine (red or dark brown) or unusual colored stools (red or black). °? Unusual bruising for unknown reasons. °? A serious fall or if you hit your head (even if there is no bleeding). ° °Some medicines may interact with Eliquis® and might increase your risk of bleeding or clotting while on Eliquis®. To help avoid this, consult your healthcare provider or pharmacist prior to using any new prescription or non-prescription medications, including herbals, vitamins, non-steroidal anti-inflammatory drugs (NSAIDs) and supplements. ° °This website has more information on Eliquis® (apixaban): http://www.eliquis.com/eliquis/home ° °

## 2018-07-10 NOTE — Transfer of Care (Signed)
Immediate Anesthesia Transfer of Care Note  Patient: Mark Crawford  Procedure(s) Performed: RIGHT TOTAL HIP ARTHROPLASTY ANTERIOR APPROACH, AND ORIF OF CALCAR FRACTURE (Right Hip) ARTHROSCOPY KNEE with medial meniscal repair (Left )  Patient Location: PACU  Anesthesia Type:MAC and Spinal  Level of Consciousness: awake, alert  and patient cooperative  Airway & Oxygen Therapy: Patient Spontanous Breathing and Patient connected to face mask oxygen  Post-op Assessment: Report given to RN and Post -op Vital signs reviewed and stable  Post vital signs: Reviewed and stable  Last Vitals:  Vitals Value Taken Time  BP 118/78 07/10/2018  2:52 PM  Temp    Pulse 62 07/10/2018  2:54 PM  Resp 17 07/10/2018  2:54 PM  SpO2 100 % 07/10/2018  2:54 PM  Vitals shown include unvalidated device data.  Last Pain:  Vitals:   07/10/18 1015  TempSrc:   PainSc: 8       Patients Stated Pain Goal: 5 (21/30/86 5784)  Complications: No apparent anesthesia complications

## 2018-07-10 NOTE — Anesthesia Procedure Notes (Signed)
Procedure Name: MAC Date/Time: 07/10/2018 12:25 PM Performed by: Dione Booze, CRNA Pre-anesthesia Checklist: Patient identified, Emergency Drugs available, Suction available and Patient being monitored Patient Re-evaluated:Patient Re-evaluated prior to induction Oxygen Delivery Method: Simple face mask Placement Confirmation: positive ETCO2

## 2018-07-10 NOTE — Plan of Care (Signed)
Plan of care 

## 2018-07-10 NOTE — Anesthesia Postprocedure Evaluation (Signed)
Anesthesia Post Note  Patient: Mark Crawford  Procedure(s) Performed: RIGHT TOTAL HIP ARTHROPLASTY ANTERIOR APPROACH, AND ORIF OF CALCAR FRACTURE (Right Hip) ARTHROSCOPY KNEE with medial meniscal repair (Left )     Patient location during evaluation: PACU Anesthesia Type: Spinal Level of consciousness: oriented and awake and alert Pain management: pain level controlled Vital Signs Assessment: post-procedure vital signs reviewed and stable Respiratory status: spontaneous breathing, respiratory function stable and patient connected to nasal cannula oxygen Cardiovascular status: blood pressure returned to baseline and stable Postop Assessment: no headache, no backache, no apparent nausea or vomiting, spinal receding and patient able to bend at knees Anesthetic complications: no    Last Vitals:  Vitals:   07/10/18 1545 07/10/18 1600  BP: 136/72 135/84  Pulse: (!) 58 71  Resp: 18 18  Temp:  36.5 C  SpO2: 100% 99%    Last Pain:  Vitals:   07/10/18 1600  TempSrc:   PainSc: 3                  Shelton Silvas

## 2018-07-10 NOTE — Op Note (Signed)
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  The crack in the calcar was fixed with a single 2 mm Zimmer/Biomet cable.  For the left knee were performed a left knee partial arthroscopic medial meniscectomy   SURGEON: Nestor Lewandowsky    ASSISTANT:   Tomi Likens. [employer]  (present throughout entire procedure and necessary for timely completion of the procedure)   ANESTHESIA: Spinal BLOOD LOSS: 300 cc FLUID REPLACEMENT: 1600 cc of crystalloid crystalloid Antibiotic: 2 g Ancef Tranexamic Acid: 1gm IV, 2gm Topical Exparel: 266mg  COMPLICATIONS: Nondisplaced intraoperative crack in the proximal calcar   INDICATIONS FOR PROCEDURE: A 62 y.o. 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 Despite conservative measures with observation, anti-inflammatory medicine, narcotics, use of a cane, has severe unremitting pain and can ambulate only a few blocks before resting. Patient desires elective right total hip arthroplasty to decrease pain and increase function, followed by partial arthroscopic medial meniscectomy left knee. The risks, benefits, and alternatives were discussed at length including but not limited to the risks of infection, bleeding, nerve injury, stiffness, blood clots, the need for revision surgery, cardiopulmonary complications, among others, and they were  willing to proceed. Questions answered      PROCEDURE IN DETAIL: The patient was identified by armband,   received preoperative IV antibiotics in the holding area at [facility], taken to the operating room , appropriate anesthetic monitors   were attached and  anesthesia was induced with the patienton the gurney. The HANA boots were applied to the feet and he was then transferred to the HANA table with a peroneal post and support underneath the non-operative le, which was locked in 2 lb traction. Theoperative lower extremity was then prepped and draped in the usual sterile fashion from just above the iliac crest to the knee. And a timeout procedure was performed.  We began the right total hip arthroplasty with a 12 cm incision along the interval at the leading edge of the tensor fascia lata of starting at 2 cm lateral to and 2 cm distal to the ASIS. Small bleeders in the skin and subcutaneous tissue identified and cauterized we dissected down to the fascia and made an incision in the fascia allowing [place] to elevate the fascia of the tensor muscle and exploited the interval between the rectus and  the tensor fascia lata. A Cobra retractor was then placed along the superior neck of the femur and a Cobra retractor along the inferior neck of the femur we teed the capsule starting out at the superior anterior aspect of the acetabulum going distally and made the T along the neck both leaflets of the T were tagged with #2 Ethibond suture. Cobra retractors were then placed along the inferior and superior neck allowing [place] to perform a standard neck cut and removed the femoral head with a power corkscrew. We then placed a right angle Hohmann retractor along the anterior aspect of the acetabulum a long bent Homan in the cotyloid notch and posteriorly a Cobra retractor. We then sequentially reamed up to a 53 mm basket reamer obtaining good coverage in all quadrants, verified by C-arm imaging. Under C-arm control  with and hammered into place a 54 mm Pinnacle cup in 45 of abduction and 15 of anteversion. The cup seated nicely and required no supplemental screws. We then placed a central hole Eliminator and a 0 polyethylene liner. The foot was then externally rotated to 130, the limb was extended and adducted delivering the proximal femur up into the wound. A medium Hohmann retractor was placed over the greater trochanter and a long Homan retractor along the posterior femoral neck completing the exposure. We then performed releases superiorly and and inferiorly of the capsule going back to the pirformis fossa superiorly and to the lesser trochanter inferiorly. We then entered the proximal femur with the box cutting offset chisel followed by, a canal sounder, the chili pepper and broaching up to a 7 broach. This seated nicely and we reamed the calcar. A trial reduction was performed with a 1.5 mm 36 mm head.The limb lengths were excellent the hip was stable in 90 of external rotation. At this point the trial components removed and we hammered into place a #7 high offset Tri-Lock stem with Gryption coating. A + 1.5 x 36 mm ceramic ball was then hammered into place the hip was reduced and final C-arm images obtained. The wound was thoroughly irrigated with normal saline solution. We repaired the ant capsule and the tensor fascia lot a with running 0 vicryl suture. the subcutaneous tissue was closed with 2-0 and 3-0 Vicryl suture followed by an Aquacil dressing.   We then modified the Hana table with the flat table extension and attached a lateral side post to the left Sparr.  The left lower extremity was then tripped prepped and draped in usual sterile fashion from the ankle to the mid thigh for the left knee arthroscopy.  Another timeout procedure was accomplished.  The peripatellar portals were infiltrated with 8 cc of a mixture of quarter percent Marcaine and Exparel.  Standard peripatellar portals were made  inferolateral inferomedial allowing introduction of the arthroscope the inferolateral portal with the pump pressure set between 7100 mmHg.  Diagnostic arthroscopy revealed relatively normal patellofemoral joint moving to the medial compartment large tearing of the medial meniscus was revealed and removed with large and small straight biters as well as a 4.2 gray-white sucker shaver and a 3.5 Gator sucker shaver.  The ACL is intact as was the PCL on the lateral side there was minimal tearing of the lateral meniscus lightly debrided.  The knee was irrigated out with normal saline solution the gutters were cleared medially and laterally.  A dressing of Xeroform 4 x 4 dressing sponges were applied and Ace wrap applied to the left knee.  At this  point both limbs were undraped patient was transferred to the gurney and taken to the recovery room without difficulty.   At this point the patient was awaken and transferred to hospital gurney without difficulty.   Nestor Lewandowsky 07/10/2018, 2:38 PM

## 2018-07-10 NOTE — Anesthesia Procedure Notes (Signed)
Spinal  Start time: 07/10/2018 12:15 PM End time: 07/10/2018 12:20 PM Staffing Anesthesiologist: Shelton Silvas, MD Performed: anesthesiologist  Preanesthetic Checklist Completed: patient identified, site marked, surgical consent, pre-op evaluation, timeout performed, IV checked, risks and benefits discussed and monitors and equipment checked Spinal Block Patient position: sitting Prep: site prepped and draped and DuraPrep Location: L3-4 Injection technique: single-shot Needle Needle type: Pencan  Needle gauge: 24 G Needle length: 10 cm Needle insertion depth: 10 cm Additional Notes Patient tolerated well. No immediate complications.

## 2018-07-10 NOTE — Anesthesia Preprocedure Evaluation (Addendum)
Anesthesia Evaluation  Patient identified by MRN, date of birth, ID band Patient awake    Reviewed: Allergy & Precautions, NPO status , Patient's Chart, lab work & pertinent test results  Airway Mallampati: II  TM Distance: >3 FB Neck ROM: Full    Dental  (+) Teeth Intact, Dental Advisory Given   Pulmonary neg pulmonary ROS,    breath sounds clear to auscultation       Cardiovascular negative cardio ROS   Rhythm:Regular Rate:Normal     Neuro/Psych negative neurological ROS     GI/Hepatic Neg liver ROS, GERD  Medicated,  Endo/Other  negative endocrine ROS  Renal/GU negative Renal ROS     Musculoskeletal  (+) Arthritis , Osteoarthritis,    Abdominal Normal abdominal exam  (+)   Peds  Hematology negative hematology ROS (+)   Anesthesia Other Findings   Reproductive/Obstetrics                            Lab Results  Component Value Date   WBC 5.7 07/04/2018   HGB 14.9 07/04/2018   HCT 45.6 07/04/2018   MCV 92.3 07/04/2018   PLT 231 07/04/2018   Lab Results  Component Value Date   INR 0.90 07/04/2018     Anesthesia Physical Anesthesia Plan  ASA: II  Anesthesia Plan: Spinal   Post-op Pain Management:    Induction: Intravenous  PONV Risk Score and Plan: 2 and Ondansetron and Propofol infusion  Airway Management Planned: Natural Airway and Simple Face Mask  Additional Equipment: None  Intra-op Plan:   Post-operative Plan:   Informed Consent: I have reviewed the patients History and Physical, chart, labs and discussed the procedure including the risks, benefits and alternatives for the proposed anesthesia with the patient or authorized representative who has indicated his/her understanding and acceptance.     Plan Discussed with: CRNA  Anesthesia Plan Comments:        Anesthesia Quick Evaluation

## 2018-07-11 ENCOUNTER — Encounter (HOSPITAL_COMMUNITY): Payer: Self-pay | Admitting: Orthopedic Surgery

## 2018-07-11 LAB — BASIC METABOLIC PANEL
Anion gap: 7 (ref 5–15)
BUN: 14 mg/dL (ref 8–23)
CALCIUM: 8.9 mg/dL (ref 8.9–10.3)
CO2: 26 mmol/L (ref 22–32)
CREATININE: 0.98 mg/dL (ref 0.61–1.24)
Chloride: 108 mmol/L (ref 98–111)
GFR calc Af Amer: >60 mL/min (ref >60)
GFR calc non Af Amer: >60 mL/min (ref >60)
GLUCOSE: 151 mg/dL — AB (ref 70–99)
Potassium: 4.6 mmol/L (ref 3.5–5.1)
SODIUM: 141 mmol/L (ref 135–145)

## 2018-07-11 LAB — CBC
HCT: 39.3 % (ref 39.0–52.0)
Hemoglobin: 12.8 g/dL — ABNORMAL LOW (ref 13.0–17.0)
MCH: 29.8 pg (ref 26.0–34.0)
MCHC: 32.6 g/dL (ref 30.0–36.0)
MCV: 91.4 fL (ref 80.0–100.0)
Platelets: 208 10*3/uL (ref 150–400)
RBC: 4.3 MIL/uL (ref 4.22–5.81)
RDW: 12.7 % (ref 11.5–15.5)
WBC: 10.7 10*3/uL — ABNORMAL HIGH (ref 4.0–10.5)

## 2018-07-11 NOTE — Progress Notes (Addendum)
PATIENT ID: Mark Crawford  MRN: [id number]  DOB/AGE:  [birth year] / 62 y.o.  1 Day Post-Op Procedure(s) (LRB): RIGHT TOTAL HIP ARTHROPLASTY ANTERIOR APPROACH, AND ORIF OF CALCAR FRACTURE (Right) ARTHROSCOPY KNEE with medial meniscal repair (Left)    PROGRESS NOTE Subjective: Patient is alert, oriented, no Nausea, no Vomiting, yes passing gas, . Taking PO well. Denies SOB, Chest or Calf Pain. Using Incentive Spirometer, PAS in place. Ambulate WBAT. Patient reports pain as  2/10  .    Objective: Vital signs in last 24 hours: Vitals:   07/10/18 1928 07/10/18 2222 07/11/18 0125 07/11/18 0451  BP: 127/74 132/67 125/73 124/80  Pulse: 84 87 77 80  Resp:  16 17   Temp: 98 F (36.7 C) 97.7 F (36.5 C) 97.9 F (36.6 C) 98.1 F (36.7 C)  TempSrc: Oral Oral Oral Oral  SpO2: 97% 96% 98% 99%  Weight:      Height:          Intake/Output from previous day: I/O last 3 completed shifts: In: 3971.4 [P.O.:720; I.V.:3051.4; IV Piggyback:200] Out: 2450 [Urine:2250; Blood:200]   Intake/Output this shift: No intake/output data recorded.   LABORATORY DATA: Recent Labs    07/11/18 0450  WBC 10.7*  HGB 12.8*  HCT 39.3  PLT 208  NA 141  K 4.6  CL 108  CO2 26  BUN 14  CREATININE 0.98  GLUCOSE 151*  CALCIUM 8.9    Examination: Neurologically intact ABD soft Neurovascular intact Sensation intact distally Intact pulses distally Dorsiflexion/Plantar flexion intact Incision: dressing C/D/I No cellulitis present Compartment soft} XR AP&Lat of hip shows well placed\fixed THA. Left knee dressing is dry knee comes to full extension, flexes 90  Assessment:   1 Day Post-Op Procedure(s) (LRB): RIGHT TOTAL HIP ARTHROPLASTY ANTERIOR APPROACH, AND ORIF OF CALCAR FRACTURE (Right) ARTHROSCOPY KNEE with medial meniscal repair (Left) ADDITIONAL DIAGNOSIS:  Expected Acute Blood Loss Anemia, History of blood clots spring of 2019.   Patient will be placed on Eliquis postop.  Plan: PT/OT WBAT,  THA  DVT Prophylaxis: SCDx72 hrs, Eliquis 2.5 mg by mouth twice a day 2 Weeks.  DISCHARGE PLAN: Home ,Today patient passes physical therapy goals DISCHARGE NEEDS: HHPT, Walker and 3-in-1 comode seatPatient ID: Mark Crawford, male   DOB: Mar 15, 1956, 62 y.o.   MRN: [id number]

## 2018-07-11 NOTE — Care Management Note (Signed)
Case Management Note  Patient Details  Name: Izmael Duross MRN: [id number] Date of Birth: [birth year]  Subjective/Objective:    Spoke with patient and spouse at bedside. Plan for HHPT, preferred [facility] but they were out of network. No other preferences.  Action/Plan: Contacted [facility], they are in network. They will accept referral with start of care 10/9. [employer] delivered RW and 3n1 to room.                Expected Discharge Date:  07/11/18               Expected Discharge Plan:  Home w Home Health Services  In-House Referral:  NA  Discharge planning Services  CM Consult  Post Acute Care Choice:  Home Health, Durable Medical Equipment Choice offered to:  Patient, Spouse  DME Arranged:  3-N-1, Walker rolling DME Agency:  [employer]  HH Arranged:  PT HH Agency:  Other - See comment([facility])  Status of Service:  Completed, signed off  If discussed at Long Length of Stay Meetings, dates discussed:    Additional Comments:  Alexis Goodell, RN 07/11/2018, 11:51 AM

## 2018-07-11 NOTE — Discharge Summary (Signed)
Patient ID: Mark Crawford MRN: [id number] DOB/AGE: 1956-02-23 62 y.o.  Admit date: 07/10/2018 Discharge date: 07/11/2018  Admission Diagnoses:  Principal Problem:   Acute medial meniscus tear of left knee Active Problems:   AVN of femur (HCC)   Primary osteoarthritis of right hip   Discharge Diagnoses:  Same  Past Medical History:  Diagnosis Date  . Arthritis   . Flu    11-2017  . GERD (gastroesophageal reflux disease)   . History of kidney stones   . History of pulmonary embolus (PE)    bil lungs 2-19    Surgeries: Procedure(s): RIGHT TOTAL HIP ARTHROPLASTY ANTERIOR APPROACH, AND ORIF OF CALCAR FRACTURE ARTHROSCOPY KNEE with medial meniscal repair on 07/10/2018   Consultants:   Discharged Condition: Improved  Hospital Course: Mark Crawford is an 62 y.o. male who was admitted 07/10/2018 for operative treatment ofAcute medial meniscus tear of left knee. Patient has severe unremitting pain that affects sleep, daily activities, and work/hobbies. After pre-op clearance the patient was taken to the operating room on 07/10/2018 and underwent  Procedure(s): RIGHT TOTAL HIP ARTHROPLASTY ANTERIOR APPROACH, AND ORIF OF CALCAR FRACTURE ARTHROSCOPY KNEE with medial meniscal repair.    Patient was given perioperative antibiotics:  Anti-infectives (From admission, onward)   Start     Dose/Rate Route Frequency Ordered Stop   07/10/18 1000  ceFAZolin (ANCEF) IVPB 2g/100 mL premix     2 g 200 mL/hr over 30 Minutes Intravenous On call to O.R. 07/10/18 4098 07/10/18 1231       Patient was given sequential compression devices, early ambulation, and chemoprophylaxis to prevent DVT.  Patient benefited maximally from hospital stay and there were no complications.    Recent vital signs:  Patient Vitals for the past 24 hrs:  BP Temp Temp src Pulse Resp SpO2 Height Weight  07/11/18 0451 124/80 98.1 F (36.7 C) Oral 80 no documentation 99 % no documentation no documentation  07/11/18 0125  125/73 97.9 F (36.6 C) Oral 77 17 98 % no documentation no documentation  07/10/18 2222 132/67 97.7 F (36.5 C) Oral 87 16 96 % no documentation no documentation  07/10/18 1928 127/74 98 F (36.7 C) Oral 84 no documentation 97 % no documentation no documentation  07/10/18 1842 137/76 98 F (36.7 C) Oral 81 14 97 % no documentation no documentation  07/10/18 1718 (Abnormal) 157/89 (Abnormal) 97.5 F (36.4 C) Oral 68 16 100 % no documentation no documentation  07/10/18 1630 (Abnormal) 151/81 (Abnormal) 97.5 F (36.4 C) Axillary 65 20 100 % 6\' 1"  (1.854 m) 101 kg  07/10/18 1627 (Abnormal) 151/81 (Abnormal) 97.5 F (36.4 C) Oral 65 20 100 % no documentation no documentation  07/10/18 1600 135/84 97.7 F (36.5 C) no documentation 71 18 99 % no documentation no documentation  07/10/18 1545 136/72 no documentation no documentation (Abnormal) 58 18 100 % no documentation no documentation  07/10/18 1530 120/68 no documentation no documentation (Abnormal) 56 13 100 % no documentation no documentation  07/10/18 1515 (Abnormal) 126/94 no documentation no documentation (Abnormal) 58 15 100 % no documentation no documentation  07/10/18 1500 114/78 no documentation no documentation 65 14 100 % no documentation no documentation  07/10/18 1452 118/78 97.6 F (36.4 C) no documentation 66 11 100 % no documentation no documentation  07/10/18 1015 no documentation no documentation no documentation no documentation no documentation no documentation 6\' 1"  (1.854 m) 101.2 kg  07/10/18 0958 130/83 98.4 F (36.9 C) Oral 72 16 99 % no documentation no documentation  Recent laboratory studies:  Recent Labs    07/11/18 0450  WBC 10.7*  HGB 12.8*  HCT 39.3  PLT 208  NA 141  K 4.6  CL 108  CO2 26  BUN 14  CREATININE 0.98  GLUCOSE 151*  CALCIUM 8.9     Discharge Medications:   Allergies as of 07/11/2018   No Known Allergies     Medication List    Take these medications   apixaban 2.5 MG Tabs  tablet Commonly known as:  ELIQUIS Take 1 tablet (2.5 mg total) by mouth 2 (two) times daily.   cetirizine 10 MG tablet Commonly known as:  ZYRTEC Take 10 mg by mouth every evening.   Melatonin 3 MG Tabs Take 6 mg by mouth at bedtime.   multivitamin with minerals Tabs tablet Take 1 tablet by mouth daily.   oxyCODONE-acetaminophen 5-325 MG tablet Commonly known as:  PERCOCET/ROXICET Take 1 tablet by mouth every 4 (four) hours as needed for severe pain.   pantoprazole 40 MG tablet Commonly known as:  PROTONIX Take 40 mg by mouth daily.   pyridOXINE 100 MG tablet Commonly known as:  VITAMIN B-6 Take 100 mg by mouth daily.   ranitidine 150 MG capsule Commonly known as:  ZANTAC Take 150 mg by mouth every evening.   tiZANidine 2 MG tablet Commonly known as:  ZANAFLEX Take 1 tablet (2 mg total) by mouth every 6 (six) hours as needed.   traZODone 100 MG tablet Commonly known as:  DESYREL Take 100 mg by mouth at bedtime.        Durable Medical Equipment  (From admission, onward)         Start     Ordered   07/10/18 1635  DME Walker rolling  Once    Question:  Patient needs a walker to treat with the following condition  Answer:  Status post right hip replacement   07/10/18 1634   07/10/18 1635  DME 3 n 1  Once     07/10/18 1634           Discharge Care Instructions  (From admission, onward)         Start     Ordered   07/11/18 0000  Change dressing    Comments:  You may change your right hipdressing if greater than 40% drainage on window of dressing.  Tomorrow.  Patient remained remove left knee dressing and applied a Band-Aid   07/11/18 0724          Diagnostic Studies: Dg Chest 2 View  Result Date: 07/04/2018 CLINICAL DATA:  Preoperative for right hip and left knee surgery. EXAM: CHEST - 2 VIEW COMPARISON:  None. FINDINGS: The heart size and mediastinal contours are within normal limits. There is no focal infiltrate, pulmonary edema, or pleural  effusion. The visualized skeletal structures are unremarkable. Patient status post prior fixation of lower cervical spine. IMPRESSION: No active cardiopulmonary disease. Electronically Signed   By: Sherian Rein M.D.   On: 07/04/2018 13:20   Dg C-arm 1-60 Min-no Report  Result Date: 07/10/2018 Fluoroscopy was utilized by the requesting physician.  No radiographic interpretation.   Dg Hip Operative Unilat W Or W/o Pelvis Right  Result Date: 07/10/2018 CLINICAL DATA:  Right hip replacement EXAM: OPERATIVE right HIP (WITH PELVIS IF PERFORMED) two VIEWS TECHNIQUE: Fluoroscopic spot image(s) were submitted for interpretation post-operatively. COMPARISON:  Pelvis film of 03/08/2018 FINDINGS: The acetabular and femoral components of the right hip replacement appear to be in  good position. No complicating features are seen. IMPRESSION: Right total hip replacement components in good position. No complicating features. Electronically Signed   By: Dwyane Dee M.D.   On: 07/10/2018 14:05    Disposition: Discharge disposition: 01-Home or Self Care       Discharge Instructions    Call MD / Call 911   Complete by:  As directed    If you experience chest pain or shortness of breath, CALL 911 and be transported to the hospital emergency room.  If you develope a fever above 101 F, pus (white drainage) or increased drainage or redness at the wound, or calf pain, call your surgeon's office.   Change dressing   Complete by:  As directed    You may change your right hipdressing if greater than 40% drainage on window of dressing.  Tomorrow.  Patient remained remove left knee dressing and applied a Band-Aid   Constipation Prevention   Complete by:  As directed    Drink plenty of fluids.  Prune juice may be helpful.  You may use a stool softener, such as Colace (over the counter) 100 mg twice a day.  Use MiraLax (over the counter) for constipation as needed.   Diet - low sodium heart healthy   Complete by:  As  directed    Increase activity slowly as tolerated   Complete by:  As directed       Follow-up Information    Gean Birchwood, MD In 2 weeks.   Specialty:  Orthopedic Surgery Contact information: [address] [place] [address] [phone]            Signed: Nestor Lewandowsky 07/11/2018, 7:24 AM

## 2018-07-11 NOTE — Progress Notes (Signed)
Physical Therapy Treatment Patient Details Name: Mark Crawford MRN: [id number] DOB: Nov 07, 1955 Today's Date: 07/11/2018    History of Present Illness R DA THA,ORIF OF CALCAR FRACTURE (Right Hip L medial arthroscopy    PT Comments    Ready for DC.   Follow Up Recommendations  Follow surgeon's recommendation for DC plan and follow-up therapies;Home health PT     Equipment Recommendations  Rolling walker with 5" wheels;3in1 (PT)    Recommendations for Other Services       Precautions / Restrictions Precautions Precautions: Fall;Knee    Mobility  Bed Mobility   Bed Mobility: Supine to Sit     Supine to sit: Supervision     General bed mobility comments: no assistnace required  Transfers   Equipment used: Rolling walker (2 wheeled) Transfers: Sit to/from Stand Sit to Stand: Supervision            Ambulation/Gait Ambulation/Gait assistance: Min guard Gait Distance (Feet): 300 Feet Assistive device: Rolling walker (2 wheeled) Gait Pattern/deviations: Step-to pattern;Step-through pattern     General Gait Details: cues for safety   Stairs Stairs: Yes Stairs assistance: Min assist Stair Management: No rails;Backwards;With walker   General stair comments: cues for technique, wife present   Wheelchair Mobility    Modified Rankin (Stroke Patients Only)       Balance                                            Cognition Arousal/Alertness: Awake/alert                                            Exercises      General Comments        Pertinent Vitals/Pain Pain Score: 2  Pain Location: right hip Pain Descriptors / Indicators: Burning Pain Intervention(s): Monitored during session;Limited activity within patient's tolerance    Home Living                      Prior Function            PT Goals (current goals can now be found in the care plan section) Progress towards PT goals: Progressing  toward goals    Frequency    7X/week      PT Plan Current plan remains appropriate    Co-evaluation              AM-PAC PT "6 Clicks" Daily Activity  Outcome Measure  Difficulty turning over in bed (including adjusting bedclothes, sheets and blankets)?: A Little Difficulty moving from lying on back to sitting on the side of the bed? : A Little Difficulty sitting down on and standing up from a chair with arms (e.g., wheelchair, bedside commode, etc,.)?: A Little Help needed moving to and from a bed to chair (including a wheelchair)?: A Little Help needed walking in hospital room?: A Little Help needed climbing 3-5 steps with a railing? : A Little 6 Click Score: 18    End of Session   Activity Tolerance: Patient tolerated treatment well Patient left: with call bell/phone within reach;with family/visitor present;in bed Nurse Communication: Mobility status PT Visit Diagnosis: Unsteadiness on feet (R26.81)     Time: 4098-1191 PT Time Calculation (min) (ACUTE ONLY): 11 min  Charges:  $Gait Training: 8-22 mins                      Blanchard Kelch PT Acute Rehabilitation Services Pager [phone] Office [phone]    Rada Hay 07/11/2018, 5:01 PM

## 2018-07-11 NOTE — Evaluation (Signed)
Physical Therapy Evaluation Patient Details Name: Mark Crawford MRN: [id number] DOB: 1956-07-10 Today's Date: 07/11/2018   History of Present Illness  R DA THA,ORIF OF CALCAR FRACTURE (Right Hip) L knee  medial arthroscopy  Clinical Impression  The patient  Ambulated slowly but well. More difficulty from low seat  For sit to stand. Plans to DC today if meets goals. Pt admitted with above diagnosis. Pt currently with functional limitations due to the deficits listed below (see PT Problem List). Pt will benefit from skilled PT to increase their independence and safety with mobility to allow discharge to the venue listed below.        Follow Up Recommendations Outpatient PT;Follow surgeon's recommendation for DC plan and follow-up therapies    Equipment Recommendations  Rolling walker with 5" wheels;3in1 (PT)    Recommendations for Other Services       Precautions / Restrictions Precautions Precautions: Fall;Knee Restrictions Weight Bearing Restrictions: No      Mobility  Bed Mobility Overal bed mobility: Needs Assistance Bed Mobility: Supine to Sit     Supine to sit: Supervision     General bed mobility comments: extra time, manages each leg  Transfers Overall transfer level: Needs assistance Equipment used: Rolling walker (2 wheeled) Transfers: Sit to/from Stand Sit to Stand: Min guard         General transfer comment: cues for  hand and each leg placement. somewhat more effort from lower reclioner with bil leg incvolved. Used 1 armrest and RW  Ambulation/Gait Ambulation/Gait assistance: Editor, commissioning (Feet): 100 Feet Assistive device: Rolling walker (2 wheeled) Gait Pattern/deviations: Step-to pattern;Decreased step length - left;Decreased stance time - right Gait velocity: decr   General Gait Details: cues for safety  Stairs Stairs: Yes Stairs assistance: Min assist Stair Management: No rails;Backwards;With walker Number of Stairs: 2 General  stair comments: cues for technique  Wheelchair Mobility    Modified Rankin (Stroke Patients Only)       Balance                                             Pertinent Vitals/Pain Pain Assessment: 0-10 Pain Score: 4  Pain Location: right hip Pain Descriptors / Indicators: Discomfort;Burning Pain Intervention(s): Monitored during session;Premedicated before session;Ice applied    Home Living Family/patient expects to be discharged to:: Private residence Living Arrangements: Spouse/significant other Available Help at Discharge: Family Type of Home: House Home Access: Stairs to enter Entrance Stairs-Rails: None Secretary/administrator of Steps: 2 Home Layout: One level Home Equipment: Environmental consultant - 2 wheels      Prior Function Level of Independence: Independent               Hand Dominance        Extremity/Trunk Assessment   Upper Extremity Assessment Upper Extremity Assessment: Overall WFL for tasks assessed    Lower Extremity Assessment Lower Extremity Assessment: RLE deficits/detail;LLE deficits/detail RLE Deficits / Details: advances the leg LLE Deficits / Details: knee flexion to 110, + SLR       Communication   Communication: No difficulties  Cognition Arousal/Alertness: Awake/alert Behavior During Therapy: WFL for tasks assessed/performed Overall Cognitive Status: Within Functional Limits for tasks assessed  General Comments      Exercises Total Joint Exercises Ankle Circles/Pumps: AROM Quad Sets: AROM;Both;10 reps;Supine Short Arc Quad: AROM;Right;10 reps;Supine Heel Slides: AAROM;Both;10 reps;Supine Hip ABduction/ADduction: AAROM;Both;10 reps;Supine Straight Leg Raises: AROM;Left;10 reps;Supine Long Arc Quad: AROM;Both;10 reps;Supine Goniometric ROM: left knee flexion 0-100 Marching in Standing: AAROM;Right;10 reps;Seated   Assessment/Plan    PT Assessment Patient  needs continued PT services  PT Problem List Decreased strength;Decreased range of motion;Decreased activity tolerance;Decreased mobility;Decreased knowledge of precautions;Decreased safety awareness;Decreased knowledge of use of DME;Pain       PT Treatment Interventions DME instruction;Therapeutic exercise;Gait training;Stair training;Functional mobility training;Therapeutic activities;Patient/family education    PT Goals (Current goals can be found in the Care Plan section)  Acute Rehab PT Goals Patient Stated Goal: go home PT Goal Formulation: With patient/family Time For Goal Achievement: 07/12/18 Potential to Achieve Goals: Good    Frequency 7X/week   Barriers to discharge        Co-evaluation               AM-PAC PT "6 Clicks" Daily Activity  Outcome Measure Difficulty turning over in bed (including adjusting bedclothes, sheets and blankets)?: A Little Difficulty moving from lying on back to sitting on the side of the bed? : A Little Difficulty sitting down on and standing up from a chair with arms (e.g., wheelchair, bedside commode, etc,.)?: A Lot Help needed moving to and from a bed to chair (including a wheelchair)?: A Lot Help needed walking in hospital room?: A Little Help needed climbing 3-5 steps with a railing? : A Lot 6 Click Score: 15    End of Session   Activity Tolerance: Patient tolerated treatment well Patient left: in chair;with call bell/phone within reach;with family/visitor present Nurse Communication: Mobility status PT Visit Diagnosis: Unsteadiness on feet (R26.81)    Time: 1610-9604 PT Time Calculation (min) (ACUTE ONLY): 43 min   Charges:   PT Evaluation $PT Eval Low Complexity: 1 Low PT Treatments $Gait Training: 8-22 mins $Therapeutic Exercise: 8-22 mins        Blanchard Kelch PT Acute Rehabilitation Services Pager [phone] Office [phone]   Rada Hay 07/11/2018, 12:44 PM

## 2018-08-03 ENCOUNTER — Other Ambulatory Visit: Payer: Self-pay | Admitting: Orthopedic Surgery

## 2018-08-03 DIAGNOSIS — M25561 Pain in right knee: Secondary | ICD-10-CM

## 2018-08-22 ENCOUNTER — Other Ambulatory Visit: Payer: Managed Care, Other (non HMO)

## 2019-08-23 ENCOUNTER — Other Ambulatory Visit: Payer: Self-pay | Admitting: Neurological Surgery

## 2019-08-24 ENCOUNTER — Other Ambulatory Visit: Payer: Self-pay | Admitting: *Deleted

## 2019-09-19 ENCOUNTER — Encounter: Payer: Self-pay | Admitting: Vascular Surgery

## 2019-10-09 ENCOUNTER — Encounter: Payer: Self-pay | Admitting: Vascular Surgery

## 2019-10-09 ENCOUNTER — Other Ambulatory Visit: Payer: Self-pay

## 2019-10-09 ENCOUNTER — Ambulatory Visit (INDEPENDENT_AMBULATORY_CARE_PROVIDER_SITE_OTHER): Payer: Medicare Other | Admitting: Vascular Surgery

## 2019-10-09 VITALS — BP 124/78 | HR 75 | Temp 98.3°F | Resp 20 | Ht 73.0 in | Wt 249.5 lb

## 2019-10-09 DIAGNOSIS — M5137 Other intervertebral disc degeneration, lumbosacral region: Secondary | ICD-10-CM | POA: Diagnosis not present

## 2019-10-09 NOTE — Progress Notes (Signed)
Vascular and Vein Specialist of [facility]  Patient name: Mark Crawford MRN: [id number] DOB: May 08, 1956 Sex: male  REASON FOR CONSULT: Discuss anterior exposure for L4-5 disc fusion  HPI: Mark Crawford is a 64 y.o. male, who is here today for discussion of anterior exposure for L4-5 disc fusion with Dr. Ellene Route.  Also discussing this with his wife by telephone due to Covid visitation restrictions.  He has a history of progressive disc disease and has failed conservative treatment.  He reports pain mainly in his back but can extend into his legs as well.  He is in consultation with Dr. Ellene Route who feels that fusion is appropriate and anterior approach is optimal.  He does have a history of kidney stones.  Has no history of prior open abdominal surgery.  Past Medical History:  Diagnosis Date  . Arthritis   . Flu    11-2017  . GERD (gastroesophageal reflux disease)   . History of kidney stones   . History of pulmonary embolus (PE)    bil lungs 2-19    History reviewed. No pertinent family history.  SOCIAL HISTORY: Social History   Socioeconomic History  . Marital status: Married    Spouse name: Not on file  . Number of children: Not on file  . Years of education: Not on file  . Highest education level: Not on file  Occupational History  . Not on file  Tobacco Use  . Smoking status: Never Smoker  . Smokeless tobacco: Never Used  Substance and Sexual Activity  . Alcohol use: Never  . Drug use: Never  . Sexual activity: Yes  Other Topics Concern  . Not on file  Social History Narrative  . Not on file   Social Determinants of Health   Financial Resource Strain:   . Difficulty of Paying Living Expenses: Not on file  Food Insecurity:   . Worried About Charity fundraiser in the Last Year: Not on file  . Ran Out of Food in the Last Year: Not on file  Transportation Needs:   . Lack of Transportation (Medical): Not on file  . Lack of  Transportation (Non-Medical): Not on file  Physical Activity:   . Days of Exercise per Week: Not on file  . Minutes of Exercise per Session: Not on file  Stress:   . Feeling of Stress : Not on file  Social Connections:   . Frequency of Communication with Friends and Family: Not on file  . Frequency of Social Gatherings with Friends and Family: Not on file  . Attends Religious Services: Not on file  . Active Member of Clubs or Organizations: Not on file  . Attends Archivist Meetings: Not on file  . Marital Status: Not on file  Intimate Partner Violence:   . Fear of Current or Ex-Partner: Not on file  . Emotionally Abused: Not on file  . Physically Abused: Not on file  . Sexually Abused: Not on file    No Known Allergies  Current Outpatient Medications  Medication Sig Dispense Refill  . cetirizine (ZYRTEC) 10 MG tablet Take 10 mg by mouth every evening.    . Cyanocobalamin (VITAMIN B 12 PO) Take by mouth.    . famotidine (PEPCID) 20 MG tablet Take 20 mg by mouth 2 (two) times daily.    . Melatonin 3 MG TABS Take 6 mg by mouth at bedtime.    . meloxicam (MOBIC) 15 MG tablet Take 15 mg by mouth daily.    Marland Kitchen  Multiple Vitamin (MULTIVITAMIN WITH MINERALS) TABS tablet Take 1 tablet by mouth daily.    . pantoprazole (PROTONIX) 40 MG tablet Take 40 mg by mouth daily.    Marland Kitchen pyridOXINE (VITAMIN B-6) 100 MG tablet Take 100 mg by mouth daily.    . rosuvastatin (CRESTOR) 20 MG tablet Take 20 mg by mouth at bedtime.    Marland Kitchen tiZANidine (ZANAFLEX) 2 MG tablet Take 1 tablet (2 mg total) by mouth every 6 (six) hours as needed. 60 tablet 0  . traMADol (ULTRAM) 50 MG tablet Take 50 mg by mouth every 6 (six) hours as needed.     No current facility-administered medications for this visit.    REVIEW OF SYSTEMS:  [X]  denotes positive finding, [ ]  denotes negative finding Cardiac  Comments:  Chest pain or chest pressure:    Shortness of breath upon exertion:    Short of breath when lying  flat:    Irregular heart rhythm:        Vascular    Pain in calf, thigh, or hip brought on by ambulation: x  neurogenic  Pain in feet at night that wakes you up from your sleep:     Blood clot in your veins:    Leg swelling:         Pulmonary    Oxygen at home:    Productive cough:     Wheezing:         Neurologic    Sudden weakness in arms or legs:     Sudden numbness in arms or legs:     Sudden onset of difficulty speaking or slurred speech:    Temporary loss of vision in one eye:     Problems with dizziness:         Gastrointestinal    Blood in stool:     Vomited blood:         Genitourinary    Burning when urinating:     Blood in urine:        Psychiatric    Major depression:         Hematologic    Bleeding problems:    Problems with blood clotting too easily:        Skin    Rashes or ulcers:        Constitutional    Fever or chills:      PHYSICAL EXAM: Vitals:   10/09/19 1322  BP: 124/78  Pulse: 75  Resp: 20  Temp: 98.3 F (36.8 C)  SpO2: 96%  Weight: 249 lb 8 oz (113.2 kg)  Height: 6\' 1"  (1.854 m)    GENERAL: The patient is a well-nourished male, in no acute distress. The vital signs are documented above. CARDIOVASCULAR: 2+ radial and 2+ popliteal pulses bilaterally PULMONARY: There is good air exchange  ABDOMEN: Soft and non-tender.  He does appear to have a small umbilical hernia physical exam MUSCULOSKELETAL: There are no major deformities or cyanosis. NEUROLOGIC: No focal weakness or paresthesias are detected. SKIN: There are no ulcers or rashes noted. PSYCHIATRIC: The patient has a normal affect.  DATA:  I have his plain films of his lumbar spine and also MRI for review.  His plain films show minimal aortic calcification.  MRI shows normal location of his bifurcation  MEDICAL ISSUES: I described my role as exposure for L4-5 fusion with Dr. .  I explained the location of the incision over the 4 5 level and also mobilization of the  rectus muscle and  retroperitoneal exposure with mobilization of the ureter and great vessels overlying the spine.  Discussed potential risk for all of these.  He is questioning the small umbilical hernia.  I explained that this may be at the area of the incision and if so could possibly close this defect with a stitch.  Explained that if the level of the L4-5 disc was below this this would not be possible and he understands.  He has had no prior intra-abdominal surgery.  Is not obese and does not have extensive aortic calcification.  I feel that he is acceptable risk for surgery   Larina Earthly, MD [facility] Vascular and Vein Specialists of [facility] Tel [phone] Pager [phone]

## 2019-10-19 NOTE — Progress Notes (Signed)
[facility] - [place], [place] - [address] [place] [address] Phone: [phone] Fax: [phone]      Your procedure is scheduled on January 21  Report to [facility] Main Entrance "A" at 0530 A.M., and check in at the Admitting office.  Call this number if you have problems the morning of surgery:  [phone]  Call [phone] if you have any questions prior to your surgery date Monday-Friday 8am-4pm    Remember:  Do not eat or drink after midnight the night before your surgery     Take these medicines the morning of surgery with A SIP OF WATER  montelukast (SINGULAIR)  pantoprazole (PROTONIX) rosuvastatin (CRESTOR)  traMADol (ULTRAM)  If needed for pain   7 days prior to surgery STOP taking any Aspirin (unless otherwise instructed by your surgeon), Aleve, Naproxen, Ibuprofen, Motrin, Advil, Goody's, BC's, all herbal medications, fish oil, and all vitamins. meloxicam (MOBIC)    The Morning of Surgery  Do not wear jewelry  Do not wear lotions, powders, or colognes, or deodorant  Do not shave 48 hours prior to surgery.  Men may shave face and neck.  Do not bring valuables to the hospital.  [facility] is not responsible for any belongings or valuables.  If you are a smoker, DO NOT Smoke 24 hours prior to surgery  If you wear a CPAP at night please bring your mask, tubing, and machine the morning of surgery   Remember that you must have someone to transport you home after your surgery, and remain with you for 24 hours if you are discharged the same day.   Please bring cases for contacts, glasses, hearing aids, dentures or bridgework because it cannot be worn into surgery.    Leave your suitcase in the car.  After surgery it may be brought to your room.  For patients admitted to the hospital, discharge time will be determined by your treatment team.  Patients discharged the day of surgery will not be allowed to drive home.     Special instructions:   [facility]- Preparing For Surgery  Before surgery, you can play an important role. Because skin is not sterile, your skin needs to be as free of germs as possible. You can reduce the number of germs on your skin by washing with CHG (chlorahexidine gluconate) Soap before surgery.  CHG is an antiseptic cleaner which kills germs and bonds with the skin to continue killing germs even after washing.    Oral Hygiene is also important to reduce your risk of infection.  Remember - BRUSH YOUR TEETH THE MORNING OF SURGERY WITH YOUR REGULAR TOOTHPASTE  Please do not use if you have an allergy to CHG or antibacterial soaps. If your skin becomes reddened/irritated stop using the CHG.  Do not shave (including legs and underarms) for at least 48 hours prior to first CHG shower. It is OK to shave your face.  Please follow these instructions carefully.   1. Shower the NIGHT BEFORE SURGERY and the MORNING OF SURGERY with CHG Soap.   2. If you chose to wash your hair, wash your hair first as usual with your normal shampoo.  3. After you shampoo, rinse your hair and body thoroughly to remove the shampoo.  4. Use CHG as you would any other liquid soap. You can apply CHG directly to the skin and wash gently with a scrungie or a clean washcloth.   5. Apply the CHG Soap  to your body ONLY FROM THE NECK DOWN.  Do not use on open wounds or open sores. Avoid contact with your eyes, ears, mouth and genitals (private parts). Wash Face and genitals (private parts)  with your normal soap.   6. Wash thoroughly, paying special attention to the area where your surgery will be performed.  7. Thoroughly rinse your body with warm water from the neck down.  8. DO NOT shower/wash with your normal soap after using and rinsing off the CHG Soap.  9. Pat yourself dry with a CLEAN TOWEL.  10. Wear CLEAN PAJAMAS to bed the night before surgery, wear comfortable clothes the morning of  surgery  11. Place CLEAN SHEETS on your bed the night of your first shower and DO NOT SLEEP WITH PETS.    Day of Surgery:  Please shower the morning of surgery with the CHG soap Do not apply any deodorants/lotions. Please wear clean clothes to the hospital/surgery center.   Remember to brush your teeth WITH YOUR REGULAR TOOTHPASTE.   Please read over the following fact sheets that you were given.

## 2019-10-22 ENCOUNTER — Encounter (HOSPITAL_COMMUNITY): Payer: Self-pay

## 2019-10-22 ENCOUNTER — Encounter (HOSPITAL_COMMUNITY)
Admission: RE | Admit: 2019-10-22 | Discharge: 2019-10-22 | Disposition: A | Discharge status: Home / Self Care | Payer: Medicare Other | Source: Ambulatory Visit | Attending: Neurological Surgery | Admitting: Neurological Surgery

## 2019-10-22 ENCOUNTER — Other Ambulatory Visit (HOSPITAL_COMMUNITY)
Admission: RE | Admit: 2019-10-22 | Discharge: 2019-10-22 | Disposition: A | Discharge status: Home / Self Care | Payer: Medicare Other | Source: Ambulatory Visit | Attending: Neurological Surgery | Admitting: Neurological Surgery

## 2019-10-22 ENCOUNTER — Other Ambulatory Visit: Payer: Self-pay

## 2019-10-22 DIAGNOSIS — M549 Dorsalgia, unspecified: Secondary | ICD-10-CM | POA: Diagnosis present

## 2019-10-22 DIAGNOSIS — Z01812 Encounter for preprocedural laboratory examination: Secondary | ICD-10-CM | POA: Insufficient documentation

## 2019-10-22 DIAGNOSIS — Z96641 Presence of right artificial hip joint: Secondary | ICD-10-CM | POA: Diagnosis not present

## 2019-10-22 DIAGNOSIS — M5116 Intervertebral disc disorders with radiculopathy, lumbar region: Secondary | ICD-10-CM | POA: Diagnosis not present

## 2019-10-22 DIAGNOSIS — K219 Gastro-esophageal reflux disease without esophagitis: Secondary | ICD-10-CM | POA: Diagnosis not present

## 2019-10-22 DIAGNOSIS — Z86711 Personal history of pulmonary embolism: Secondary | ICD-10-CM | POA: Diagnosis not present

## 2019-10-22 DIAGNOSIS — Z20822 Contact with and (suspected) exposure to covid-19: Secondary | ICD-10-CM | POA: Diagnosis not present

## 2019-10-22 DIAGNOSIS — Z791 Long term (current) use of non-steroidal anti-inflammatories (NSAID): Secondary | ICD-10-CM | POA: Diagnosis not present

## 2019-10-22 DIAGNOSIS — M4316 Spondylolisthesis, lumbar region: Secondary | ICD-10-CM | POA: Diagnosis not present

## 2019-10-22 HISTORY — DX: Other specified postprocedural states: Z98.890

## 2019-10-22 HISTORY — DX: Other specified postprocedural states: R11.2

## 2019-10-22 LAB — BASIC METABOLIC PANEL
Anion gap: 8 (ref 5–15)
BUN: 16 mg/dL (ref 8–23)
CO2: 27 mmol/L (ref 22–32)
Calcium: 9.3 mg/dL (ref 8.9–10.3)
Chloride: 104 mmol/L (ref 98–111)
Creatinine, Ser: 1.16 mg/dL (ref 0.61–1.24)
GFR calc Af Amer: >60 mL/min (ref >60)
GFR calc non Af Amer: >60 mL/min (ref >60)
Glucose, Bld: 95 mg/dL (ref 70–99)
Potassium: 3.9 mmol/L (ref 3.5–5.1)
Sodium: 139 mmol/L (ref 135–145)

## 2019-10-22 LAB — TYPE AND SCREEN
ABO/RH(D): A POS
Antibody Screen: NEGATIVE

## 2019-10-22 LAB — CBC
HCT: 48.9 % (ref 39.0–52.0)
Hemoglobin: 16.3 g/dL (ref 13.0–17.0)
MCH: 30.8 pg (ref 26.0–34.0)
MCHC: 33.3 g/dL (ref 30.0–36.0)
MCV: 92.3 fL (ref 80.0–100.0)
Platelets: 203 10*3/uL (ref 150–400)
RBC: 5.3 MIL/uL (ref 4.22–5.81)
RDW: 12.3 % (ref 11.5–15.5)
WBC: 6.8 10*3/uL (ref 4.0–10.5)
nRBC: 0 % (ref 0.0–0.2)

## 2019-10-22 LAB — SURGICAL PCR SCREEN
MRSA, PCR: NEGATIVE
Staphylococcus aureus: NEGATIVE

## 2019-10-22 LAB — ABO/RH: ABO/RH(D): A POS

## 2019-10-22 MED ORDER — CHLORHEXIDINE GLUCONATE 4 % EX LIQD: 60.0000 mL | Freq: Once | CUTANEOUS | Status: DC

## 2019-10-22 NOTE — Progress Notes (Signed)
PCP - Dr Allene Dillon Cardiologist - na  Chest x-ray - na EKG - na   -    C  -  C     Blood Thinner Instructions:stop Aspirin Instructions:stop  -    COVID TEST- today   Anesthesia review: na  Patient denies shortness of breath, fever, cough and chest pain at PAT appointment   All instructions explained to the patient, with a verbal understanding of the material. Patient agrees to go over the instructions while at home for a better understanding. Patient also instructed to self quarantine after being tested for COVID-19. The opportunity to ask questions was provided.

## 2019-10-23 LAB — NOVEL CORONAVIRUS, NAA (HOSP ORDER, SEND-OUT TO REF LAB; TAT 18-24 HRS): SARS-CoV-2, NAA: NOT DETECTED

## 2019-10-25 ENCOUNTER — Inpatient Hospital Stay (HOSPITAL_COMMUNITY): Payer: Medicare Other | Admitting: Anesthesiology

## 2019-10-25 ENCOUNTER — Encounter (HOSPITAL_COMMUNITY)
Admission: RE | Disposition: A | Discharge status: Home / Self Care | Payer: Self-pay | Source: Home / Self Care | Attending: Neurological Surgery

## 2019-10-25 ENCOUNTER — Inpatient Hospital Stay (HOSPITAL_COMMUNITY): Payer: Medicare Other

## 2019-10-25 ENCOUNTER — Encounter (HOSPITAL_COMMUNITY): Payer: Self-pay | Admitting: Neurological Surgery

## 2019-10-25 ENCOUNTER — Inpatient Hospital Stay (HOSPITAL_COMMUNITY)
Admission: RE | Admit: 2019-10-25 | Discharge: 2019-10-26 | DRG: 460 | Disposition: A | Discharge status: Home / Self Care | Payer: Medicare Other | Attending: Neurological Surgery | Admitting: Neurological Surgery

## 2019-10-25 ENCOUNTER — Other Ambulatory Visit: Payer: Self-pay

## 2019-10-25 DIAGNOSIS — M5116 Intervertebral disc disorders with radiculopathy, lumbar region: Secondary | ICD-10-CM | POA: Diagnosis not present

## 2019-10-25 DIAGNOSIS — Z419 Encounter for procedure for purposes other than remedying health state, unspecified: Secondary | ICD-10-CM

## 2019-10-25 DIAGNOSIS — Z20822 Contact with and (suspected) exposure to covid-19: Secondary | ICD-10-CM | POA: Diagnosis not present

## 2019-10-25 DIAGNOSIS — M4316 Spondylolisthesis, lumbar region: Secondary | ICD-10-CM | POA: Diagnosis present

## 2019-10-25 DIAGNOSIS — M5416 Radiculopathy, lumbar region: Secondary | ICD-10-CM | POA: Diagnosis not present

## 2019-10-25 DIAGNOSIS — Z96641 Presence of right artificial hip joint: Secondary | ICD-10-CM | POA: Diagnosis present

## 2019-10-25 DIAGNOSIS — K219 Gastro-esophageal reflux disease without esophagitis: Secondary | ICD-10-CM | POA: Diagnosis not present

## 2019-10-25 DIAGNOSIS — Z86711 Personal history of pulmonary embolism: Secondary | ICD-10-CM | POA: Diagnosis not present

## 2019-10-25 DIAGNOSIS — Z791 Long term (current) use of non-steroidal anti-inflammatories (NSAID): Secondary | ICD-10-CM | POA: Diagnosis not present

## 2019-10-25 DIAGNOSIS — M549 Dorsalgia, unspecified: Secondary | ICD-10-CM | POA: Diagnosis present

## 2019-10-25 HISTORY — PX: ANTERIOR LUMBAR FUSION: SHX1170

## 2019-10-25 HISTORY — PX: ABDOMINAL EXPOSURE: SHX5708

## 2019-10-25 SURGERY — ANTERIOR LUMBAR FUSION 1 LEVEL
Anesthesia: General | Site: Spine Lumbar

## 2019-10-25 MED ORDER — TRAMADOL HCL 50 MG PO TABS
50.0000 mg | ORAL_TABLET | Freq: Four times a day (QID) | ORAL | Status: DC | PRN
  Administered 2019-10-25: 50 mg via ORAL
  Filled 2019-10-25 (×2): qty 1

## 2019-10-25 MED ORDER — DOCUSATE SODIUM 100 MG PO CAPS
100.0000 mg | ORAL_CAPSULE | Freq: Two times a day (BID) | ORAL | Status: DC
  Administered 2019-10-25 – 2019-10-26 (×3): 100 mg via ORAL
  Filled 2019-10-25 (×3): qty 1

## 2019-10-25 MED ORDER — MONTELUKAST SODIUM 10 MG PO TABS
10.0000 mg | ORAL_TABLET | Freq: Every day | ORAL | Status: DC
  Administered 2019-10-26: 10 mg via ORAL
  Filled 2019-10-25 (×2): qty 1

## 2019-10-25 MED ORDER — LIDOCAINE-EPINEPHRINE (PF) 1.5 %-1:200000 IJ SOLN
INTRAMUSCULAR | Status: DC | PRN
  Administered 2019-10-25 (×2): 30 mL via PERINEURAL

## 2019-10-25 MED ORDER — ONDANSETRON HCL 4 MG/2ML IJ SOLN
INTRAMUSCULAR | Status: DC | PRN
  Administered 2019-10-25: 4 mg via INTRAVENOUS

## 2019-10-25 MED ORDER — PANTOPRAZOLE SODIUM 40 MG PO TBEC
40.0000 mg | DELAYED_RELEASE_TABLET | Freq: Every day | ORAL | Status: DC
  Administered 2019-10-25 – 2019-10-26 (×2): 40 mg via ORAL
  Filled 2019-10-25 (×2): qty 1

## 2019-10-25 MED ORDER — PHENOL 1.4 % MT LIQD: 1.0000 | OROMUCOSAL | Status: DC | PRN

## 2019-10-25 MED ORDER — SODIUM CHLORIDE 0.9% FLUSH
3.0000 mL | Freq: Two times a day (BID) | INTRAVENOUS | Status: DC
  Administered 2019-10-25: 21:00:00 3 mL via INTRAVENOUS

## 2019-10-25 MED ORDER — SUGAMMADEX SODIUM 200 MG/2ML IV SOLN
INTRAVENOUS | Status: DC | PRN
  Administered 2019-10-25: 200 mg via INTRAVENOUS

## 2019-10-25 MED ORDER — SENNA 8.6 MG PO TABS
1.0000 | ORAL_TABLET | Freq: Two times a day (BID) | ORAL | Status: DC
  Administered 2019-10-25 – 2019-10-26 (×3): 8.6 mg via ORAL
  Filled 2019-10-25 (×3): qty 1

## 2019-10-25 MED ORDER — MIDAZOLAM HCL 2 MG/2ML IJ SOLN
INTRAMUSCULAR | Status: AC
  Filled 2019-10-25: qty 2

## 2019-10-25 MED ORDER — ROSUVASTATIN CALCIUM 20 MG PO TABS
20.0000 mg | ORAL_TABLET | Freq: Every day | ORAL | Status: DC
  Administered 2019-10-26: 20 mg via ORAL
  Filled 2019-10-25: qty 1

## 2019-10-25 MED ORDER — POLYETHYLENE GLYCOL 3350 17 G PO PACK
17.0000 g | PACK | Freq: Every day | ORAL | Status: DC | PRN
  Administered 2019-10-26: 17 g via ORAL
  Filled 2019-10-25: qty 1

## 2019-10-25 MED ORDER — SENNOSIDES-DOCUSATE SODIUM 8.6-50 MG PO TABS: 1.0000 | ORAL_TABLET | Freq: Every evening | ORAL | Status: DC

## 2019-10-25 MED ORDER — DEXAMETHASONE SODIUM PHOSPHATE 10 MG/ML IJ SOLN
INTRAMUSCULAR | Status: DC | PRN
  Administered 2019-10-25: 10 mg via INTRAVENOUS

## 2019-10-25 MED ORDER — METHOCARBAMOL 1000 MG/10ML IJ SOLN
500.0000 mg | Freq: Four times a day (QID) | INTRAVENOUS | Status: DC | PRN
  Filled 2019-10-25: qty 5

## 2019-10-25 MED ORDER — PROMETHAZINE HCL 25 MG/ML IJ SOLN
INTRAMUSCULAR | Status: DC | PRN
  Administered 2019-10-25: 6.25 mg via INTRAVENOUS

## 2019-10-25 MED ORDER — BUPIVACAINE-EPINEPHRINE (PF) 0.5% -1:200000 IJ SOLN
INTRAMUSCULAR | Status: DC | PRN
  Administered 2019-10-25: 30 mL

## 2019-10-25 MED ORDER — TRAZODONE HCL 100 MG PO TABS
100.0000 mg | ORAL_TABLET | Freq: Every day | ORAL | Status: DC
  Administered 2019-10-25: 100 mg via ORAL
  Filled 2019-10-25: qty 1

## 2019-10-25 MED ORDER — SODIUM CHLORIDE 0.9 % IV SOLN: 250.0000 mL | INTRAVENOUS | Status: DC

## 2019-10-25 MED ORDER — CHLORHEXIDINE GLUCONATE CLOTH 2 % EX PADS: 6.0000 | MEDICATED_PAD | Freq: Once | CUTANEOUS | Status: DC

## 2019-10-25 MED ORDER — DEXAMETHASONE SODIUM PHOSPHATE 10 MG/ML IJ SOLN
INTRAMUSCULAR | Status: AC
  Filled 2019-10-25: qty 1

## 2019-10-25 MED ORDER — SODIUM CHLORIDE 0.9 % IV SOLN
INTRAVENOUS | Status: DC | PRN
  Administered 2019-10-25: 09:00:00 500 mL

## 2019-10-25 MED ORDER — KETOROLAC TROMETHAMINE 15 MG/ML IJ SOLN
7.5000 mg | Freq: Four times a day (QID) | INTRAMUSCULAR | Status: AC
  Administered 2019-10-25 – 2019-10-26 (×4): 7.5 mg via INTRAVENOUS
  Filled 2019-10-25 (×4): qty 1

## 2019-10-25 MED ORDER — ONDANSETRON HCL 4 MG PO TABS: 4.0000 mg | ORAL_TABLET | Freq: Four times a day (QID) | ORAL | Status: DC | PRN

## 2019-10-25 MED ORDER — LIDOCAINE-EPINEPHRINE 1 %-1:100000 IJ SOLN
INTRAMUSCULAR | Status: AC
  Filled 2019-10-25: qty 1

## 2019-10-25 MED ORDER — LIDOCAINE 2% (20 MG/ML) 5 ML SYRINGE
INTRAMUSCULAR | Status: DC | PRN
  Administered 2019-10-25: 100 mg via INTRAVENOUS

## 2019-10-25 MED ORDER — ALUM & MAG HYDROXIDE-SIMETH 200-200-20 MG/5ML PO SUSP: 30.0000 mL | Freq: Four times a day (QID) | ORAL | Status: DC | PRN

## 2019-10-25 MED ORDER — CEFAZOLIN SODIUM-DEXTROSE 2-4 GM/100ML-% IV SOLN
INTRAVENOUS | Status: AC
  Filled 2019-10-25: qty 100

## 2019-10-25 MED ORDER — ONDANSETRON HCL 4 MG/2ML IJ SOLN
INTRAMUSCULAR | Status: AC
  Filled 2019-10-25: qty 2

## 2019-10-25 MED ORDER — PROPOFOL 10 MG/ML IV BOLUS
INTRAVENOUS | Status: DC | PRN
  Administered 2019-10-25: 120 mg via INTRAVENOUS

## 2019-10-25 MED ORDER — FLEET ENEMA 7-19 GM/118ML RE ENEM: 1.0000 | ENEMA | Freq: Once | RECTAL | Status: DC | PRN

## 2019-10-25 MED ORDER — FENTANYL CITRATE (PF) 250 MCG/5ML IJ SOLN
INTRAMUSCULAR | Status: AC
  Filled 2019-10-25: qty 5

## 2019-10-25 MED ORDER — THROMBIN 5000 UNITS EX SOLR
CUTANEOUS | Status: AC
  Filled 2019-10-25: qty 5000

## 2019-10-25 MED ORDER — LACTATED RINGERS IV SOLN: INTRAVENOUS | Status: DC

## 2019-10-25 MED ORDER — THROMBIN 5000 UNITS EX SOLR
OROMUCOSAL | Status: DC | PRN
  Administered 2019-10-25: 09:00:00 5 mL via TOPICAL

## 2019-10-25 MED ORDER — MIDAZOLAM HCL 5 MG/5ML IJ SOLN
INTRAMUSCULAR | Status: DC | PRN
  Administered 2019-10-25: 2 mg via INTRAVENOUS

## 2019-10-25 MED ORDER — TRAMADOL HCL 50 MG PO TABS
50.0000 mg | ORAL_TABLET | ORAL | Status: DC | PRN
  Administered 2019-10-25 – 2019-10-26 (×3): 50 mg via ORAL
  Filled 2019-10-25 (×2): qty 1

## 2019-10-25 MED ORDER — ROCURONIUM BROMIDE 50 MG/5ML IV SOSY
PREFILLED_SYRINGE | INTRAVENOUS | Status: DC | PRN
  Administered 2019-10-25: 100 mg via INTRAVENOUS
  Administered 2019-10-25: 30 mg via INTRAVENOUS

## 2019-10-25 MED ORDER — FENTANYL CITRATE (PF) 250 MCG/5ML IJ SOLN
INTRAMUSCULAR | Status: DC | PRN
  Administered 2019-10-25: 50 ug via INTRAVENOUS
  Administered 2019-10-25: 150 ug via INTRAVENOUS
  Administered 2019-10-25: 50 ug via INTRAVENOUS

## 2019-10-25 MED ORDER — LACTATED RINGERS IV SOLN: INTRAVENOUS | Status: DC | PRN

## 2019-10-25 MED ORDER — CEFAZOLIN SODIUM-DEXTROSE 2-4 GM/100ML-% IV SOLN
2.0000 g | Freq: Three times a day (TID) | INTRAVENOUS | Status: AC
  Administered 2019-10-25 (×2): 2 g via INTRAVENOUS
  Filled 2019-10-25 (×2): qty 100

## 2019-10-25 MED ORDER — LORATADINE 10 MG PO TABS
10.0000 mg | ORAL_TABLET | Freq: Every day | ORAL | Status: DC
  Administered 2019-10-25 – 2019-10-26 (×2): 10 mg via ORAL
  Filled 2019-10-25 (×2): qty 1

## 2019-10-25 MED ORDER — PROPOFOL 10 MG/ML IV BOLUS
INTRAVENOUS | Status: AC
  Filled 2019-10-25: qty 40

## 2019-10-25 MED ORDER — FAMOTIDINE 20 MG PO TABS
40.0000 mg | ORAL_TABLET | Freq: Every evening | ORAL | Status: DC
  Administered 2019-10-25: 40 mg via ORAL
  Filled 2019-10-25: qty 2

## 2019-10-25 MED ORDER — CEFAZOLIN SODIUM-DEXTROSE 2-4 GM/100ML-% IV SOLN
2.0000 g | INTRAVENOUS | Status: AC
  Administered 2019-10-25: 2 g via INTRAVENOUS

## 2019-10-25 MED ORDER — LIDOCAINE 2% (20 MG/ML) 5 ML SYRINGE
INTRAMUSCULAR | Status: AC
  Filled 2019-10-25: qty 5

## 2019-10-25 MED ORDER — ACETAMINOPHEN 650 MG RE SUPP: 650.0000 mg | RECTAL | Status: DC | PRN

## 2019-10-25 MED ORDER — SCOPOLAMINE 1 MG/3DAYS TD PT72
MEDICATED_PATCH | TRANSDERMAL | Status: AC
  Filled 2019-10-25: qty 1

## 2019-10-25 MED ORDER — EPHEDRINE SULFATE-NACL 50-0.9 MG/10ML-% IV SOSY
PREFILLED_SYRINGE | INTRAVENOUS | Status: DC | PRN
  Administered 2019-10-25 (×2): 10 mg via INTRAVENOUS

## 2019-10-25 MED ORDER — METHOCARBAMOL 500 MG PO TABS
500.0000 mg | ORAL_TABLET | Freq: Four times a day (QID) | ORAL | Status: DC | PRN
  Administered 2019-10-25 – 2019-10-26 (×3): 500 mg via ORAL
  Filled 2019-10-25 (×3): qty 1

## 2019-10-25 MED ORDER — MENTHOL 3 MG MT LOZG: 1.0000 | LOZENGE | OROMUCOSAL | Status: DC | PRN

## 2019-10-25 MED ORDER — ACETAMINOPHEN 325 MG PO TABS: 650.0000 mg | ORAL_TABLET | ORAL | Status: DC | PRN

## 2019-10-25 MED ORDER — ONDANSETRON HCL 4 MG/2ML IJ SOLN: 4.0000 mg | Freq: Four times a day (QID) | INTRAMUSCULAR | Status: DC | PRN

## 2019-10-25 MED ORDER — BISACODYL 10 MG RE SUPP: 10.0000 mg | Freq: Every day | RECTAL | Status: DC | PRN

## 2019-10-25 MED ORDER — SODIUM CHLORIDE 0.9% FLUSH: 3.0000 mL | INTRAVENOUS | Status: DC | PRN

## 2019-10-25 MED ORDER — BUPIVACAINE HCL (PF) 0.5 % IJ SOLN
INTRAMUSCULAR | Status: AC
  Filled 2019-10-25: qty 30

## 2019-10-25 MED ORDER — MORPHINE SULFATE (PF) 2 MG/ML IV SOLN: 2.0000 mg | INTRAVENOUS | Status: DC | PRN

## 2019-10-25 MED ORDER — ROCURONIUM BROMIDE 10 MG/ML (PF) SYRINGE
PREFILLED_SYRINGE | INTRAVENOUS | Status: AC
  Filled 2019-10-25: qty 10

## 2019-10-25 MED ORDER — 0.9 % SODIUM CHLORIDE (POUR BTL) OPTIME
TOPICAL | Status: DC | PRN
  Administered 2019-10-25: 09:00:00 1000 mL

## 2019-10-25 SURGICAL SUPPLY — 65 items
APPLIER CLIP 11 MED OPEN (CLIP) ×3
BAG DECANTER FOR FLEXI CONT (MISCELLANEOUS) ×3 IMPLANT
BASE TI BOLT 5.0X22.5 VARIABLE (Bolt) ×3 IMPLANT
BASKET BONE COLLECTION (BASKET) IMPLANT
BOLT BASE TI 5.0X25 VARIABLE (Bolt) ×6 IMPLANT
BONE MATRIX OSTEOCEL PRO LRG (Bone Implant) ×3 IMPLANT
BUR BARREL STRAIGHT FLUTE 4.0 (BURR) ×3 IMPLANT
BUR MATCHSTICK NEURO 3.0 LAGG (BURR) ×3 IMPLANT
CANISTER SUCT 3000ML PPV (MISCELLANEOUS) ×3 IMPLANT
CLIP APPLIE 11 MED OPEN (CLIP) ×2 IMPLANT
COVER BACK TABLE 60X90IN (DRAPES) ×3 IMPLANT
COVER WAND RF STERILE (DRAPES) IMPLANT
DECANTER SPIKE VIAL GLASS SM (MISCELLANEOUS) IMPLANT
DERMABOND ADVANCED (GAUZE/BANDAGES/DRESSINGS) ×1
DERMABOND ADVANCED .7 DNX12 (GAUZE/BANDAGES/DRESSINGS) ×2 IMPLANT
DRAPE C-ARM 42X72 X-RAY (DRAPES) ×3 IMPLANT
DRAPE C-ARMOR (DRAPES) ×3 IMPLANT
DRAPE LAPAROTOMY 100X72X124 (DRAPES) ×3 IMPLANT
DURAPREP 26ML APPLICATOR (WOUND CARE) ×3 IMPLANT
ELECT BLADE 4.0 EZ CLEAN MEGAD (MISCELLANEOUS) ×3
ELECT REM PT RETURN 9FT ADLT (ELECTROSURGICAL) ×3
ELECTRODE BLDE 4.0 EZ CLN MEGD (MISCELLANEOUS) ×2 IMPLANT
ELECTRODE REM PT RTRN 9FT ADLT (ELECTROSURGICAL) ×2 IMPLANT
GAUZE 4X4 16PLY RFD (DISPOSABLE) IMPLANT
GLOVE BIO SURGEON STRL SZ7 (GLOVE) ×3 IMPLANT
GLOVE BIOGEL PI IND STRL 7.5 (GLOVE) ×4 IMPLANT
GLOVE BIOGEL PI IND STRL 8.5 (GLOVE) ×2 IMPLANT
GLOVE BIOGEL PI INDICATOR 7.5 (GLOVE) ×2
GLOVE BIOGEL PI INDICATOR 8.5 (GLOVE) ×1
GLOVE ECLIPSE 6.5 STRL STRAW (GLOVE) ×3 IMPLANT
GLOVE ECLIPSE 8.5 STRL (GLOVE) ×3 IMPLANT
GLOVE SS BIOGEL STRL SZ 7.5 (GLOVE) ×2 IMPLANT
GLOVE SUPERSENSE BIOGEL SZ 7.5 (GLOVE) ×1
GLOVE SURG SS PI 7.0 STRL IVOR (GLOVE) ×12 IMPLANT
GOWN STRL REUS W/ TWL LRG LVL3 (GOWN DISPOSABLE) ×2 IMPLANT
GOWN STRL REUS W/ TWL XL LVL3 (GOWN DISPOSABLE) ×4 IMPLANT
GOWN STRL REUS W/TWL 2XL LVL3 (GOWN DISPOSABLE) ×3 IMPLANT
GOWN STRL REUS W/TWL LRG LVL3 (GOWN DISPOSABLE) ×1
GOWN STRL REUS W/TWL XL LVL3 (GOWN DISPOSABLE) ×2
HEMOSTAT POWDER KIT SURGIFOAM (HEMOSTASIS) ×3 IMPLANT
IMPL BASE TI 8X42X30 10D (Cage) ×2 IMPLANT
IMPLANT BASE TI 8X42X30 10D (Cage) ×3 IMPLANT
KIT BASIN OR (CUSTOM PROCEDURE TRAY) ×3 IMPLANT
KIT TURNOVER KIT B (KITS) ×3 IMPLANT
NEEDLE HYPO 25X1 1.5 SAFETY (NEEDLE) ×3 IMPLANT
NEEDLE SPNL 18GX3.5 QUINCKE PK (NEEDLE) ×3 IMPLANT
NS IRRIG 1000ML POUR BTL (IV SOLUTION) ×3 IMPLANT
PACK LAMINECTOMY NEURO (CUSTOM PROCEDURE TRAY) ×3 IMPLANT
SPONGE INTESTINAL PEANUT (DISPOSABLE) ×6 IMPLANT
SPONGE LAP 18X18 RF (DISPOSABLE) ×3 IMPLANT
SPONGE SURGIFOAM ABS GEL 100 (HEMOSTASIS) IMPLANT
SUT PDS AB 1 CTX 36 (SUTURE) ×3 IMPLANT
SUT SILK 2 0 TIES 10X30 (SUTURE) ×3 IMPLANT
SUT VIC AB 0 CT1 27 (SUTURE)
SUT VIC AB 0 CT1 27XBRD ANBCTR (SUTURE) IMPLANT
SUT VIC AB 1 CT1 18XBRD ANBCTR (SUTURE) ×2 IMPLANT
SUT VIC AB 1 CT1 8-18 (SUTURE) ×1
SUT VIC AB 2-0 CP2 18 (SUTURE) ×3 IMPLANT
SUT VIC AB 3-0 SH 8-18 (SUTURE) ×3 IMPLANT
SUT VIC AB 4-0 RB1 18 (SUTURE) ×3 IMPLANT
SUT VICRYL 4-0 PS2 18IN ABS (SUTURE) IMPLANT
TOWEL GREEN STERILE (TOWEL DISPOSABLE) ×3 IMPLANT
TOWEL GREEN STERILE FF (TOWEL DISPOSABLE) ×3 IMPLANT
TRAY FOLEY MTR SLVR 16FR STAT (SET/KITS/TRAYS/PACK) ×3 IMPLANT
WATER STERILE IRR 1000ML POUR (IV SOLUTION) ×3 IMPLANT

## 2019-10-25 NOTE — Transfer of Care (Signed)
Immediate Anesthesia Transfer of Care Note  Patient: Mark Crawford  Procedure(s) Performed: Lumbar Four-Five  Anterior lumbar interbody fusion (N/A Spine Lumbar) Abdominal Exposure (N/A Abdomen)  Patient Location: PACU  Anesthesia Type:General  Level of Consciousness: awake, alert  and oriented  Airway & Oxygen Therapy: Patient Spontanous Breathing  Post-op Assessment: Report given to RN  Post vital signs: Reviewed and stable  Last Vitals:  Vitals Value Taken Time  BP 162/74 10/25/19 1041  Temp 36.6 C 10/25/19 1041  Pulse 92 10/25/19 1046  Resp 16 10/25/19 1046  SpO2 95 % 10/25/19 1046  Vitals shown include unvalidated device data.  Last Pain:  Vitals:   10/25/19 0628  TempSrc:   PainSc: 4          Complications: No apparent anesthesia complications

## 2019-10-25 NOTE — Op Note (Signed)
    OPERATIVE REPORT  DATE OF SURGERY: 10/25/2019  PATIENT: Mark Crawford, 64 y.o. male MRN: [id number]  DOB: 01-14-1956  PRE-OPERATIVE DIAGNOSIS: Degenerative disc disease  POST-OPERATIVE DIAGNOSIS:  Same  PROCEDURE: Anterior exposure for L4-5 interbody fusion  SURGEON:  Gretta Began, M.D.  Co-surgeon for the exposure Dr. Barnett Abu  ANESTHESIA: General  EBL: per anesthesia record  Total I/O In: 1000 [I.V.:1000] Out: 350 [Urine:250; Blood:100]  BLOOD ADMINISTERED: none  DRAINS: none  SPECIMEN: none  COUNTS CORRECT:  YES  PATIENT DISPOSITION:  PACU - hemodynamically stable  PROCEDURE DETAILS: The patient was taken to the operating placed supine position where the area of the abdomen was prepped and draped in usual sterile fashion.  Crosstable lateral C-arm projections used to identify the level of the 4 5 disc.  Incision was made left the meatus with electrocautery.  The anterior rectus sheath was opened in line with the skin incision.  The rectus muscle was mobilized in the medial extraperitoneal retroperitoneal space was entered bluntly in the left lower quadrant.  The peritoneal contents were mobilized to the right and the posterior sheath was opened laterally.  Blunt dissection was continued above the level of the psoas muscle to the level of the spine.  The patient had 3 large iliolumbar veins and these were ligated with 2-0 silk ties and divided.  Blunt dissection was continued to allow adequate exposure to the right of the L4-5 disc.  The Thompson retractor was positioned in reverse lip 150 blade to the right and left of the L4-5 disc and malleable retractors were used for superior and inferior exposure.  A spinal needle was placed in the 4 5 disc and C-arm was brought back onto the field to confirm this was the appropriate level.  The remaining portion of the procedure will be dictated as a separate note by Dr. Beverlee Nims, M.D., [facility] 10/25/2019 10:42  AM

## 2019-10-25 NOTE — Anesthesia Procedure Notes (Signed)
Procedure Name: Intubation Date/Time: 10/25/2019 7:53 AM Performed by: Griffin Dakin, CRNA Pre-anesthesia Checklist: Patient identified, Emergency Drugs available, Suction available and Patient being monitored Patient Re-evaluated:Patient Re-evaluated prior to induction Oxygen Delivery Method: Circle system utilized Preoxygenation: Pre-oxygenation with 100% oxygen Induction Type: IV induction Ventilation: Mask ventilation without difficulty Laryngoscope Size: Mac and 4 Grade View: Grade III Tube type: Oral Tube size: 7.5 mm Number of attempts: 1 Airway Equipment and Method: Stylet and Oral airway Placement Confirmation: ETT inserted through vocal cords under direct vision,  positive ETCO2 and breath sounds checked- equal and bilateral Secured at: 23 cm Tube secured with: Tape Dental Injury: Teeth and Oropharynx as per pre-operative assessment

## 2019-10-25 NOTE — H&P (Signed)
Mark Crawford is an 64 y.o. male.   Chief Complaint: Back pain bilateral hip and buttock pain HPI: Mark Crawford is a 64 year old individual whose had some moderate amount of disc degeneration at L4-L5 that is caused a considerable amount of localized back pain but now with radicular pain into the buttocks on one leg or the other depending on his positioning.  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  Past Medical History:  Diagnosis Date  . Arthritis   . Flu    11-2017  . GERD (gastroesophageal reflux disease)   . History of kidney stones   . History of pulmonary embolus (PE)    bil lungs 2-19  . PONV (postoperative nausea and vomiting)     Past Surgical History:  Procedure Laterality Date  . EYE SURGERY     bil cataract  . FRACTURE SURGERY     rod in right arm  . JOINT REPLACEMENT     right total hip Dr. Mayer Camel 07-10-18  . kidney flone surgery    . KNEE ARTHROSCOPY Left 07/10/2018   Procedure: ARTHROSCOPY KNEE with medial meniscal repair;  Surgeon: Frederik Pear, MD;  Location: [facility];  Service: Orthopedics;  Laterality: Left;  . NECK SURGERY     2008  . TOTAL HIP ARTHROPLASTY Right 07/10/2018   Procedure: RIGHT TOTAL HIP ARTHROPLASTY ANTERIOR APPROACH, AND ORIF OF CALCAR FRACTURE;  Surgeon: Frederik Pear, MD;  Location: [facility];  Service: Orthopedics;  Laterality: Right;  Marland Kitchen VASECTOMY      History reviewed. No pertinent family history. Social History:  reports that he has never smoked. He has never used smokeless tobacco. He reports that he does not drink alcohol or use drugs.  Allergies: No Known Allergies  Medications Prior to Admission  Medication Sig Dispense Refill  . Ascorbic Acid (VITAMIN C PO) Take 1 tablet by  mouth daily.    . cetirizine (ZYRTEC) 10 MG tablet Take 10 mg by mouth every evening.    . Cyanocobalamin (VITAMIN B 12 PO) Take 1,000 mcg by mouth daily.     . famotidine (PEPCID) 40 MG tablet Take 40 mg by mouth every evening.     . meloxicam (MOBIC) 15 MG tablet Take 15 mg by mouth daily.    . montelukast (SINGULAIR) 10 MG tablet Take 10 mg by mouth daily.    . Multiple Vitamin (MULTIVITAMIN WITH MINERALS) TABS tablet Take 1 tablet by mouth daily.    . pantoprazole (PROTONIX) 40 MG tablet Take 40 mg by mouth daily.    . rosuvastatin (CRESTOR) 20 MG tablet Take 20 mg by mouth daily.     Marland Kitchen senna-docusate (DOK PLUS) 8.6-50 MG tablet Take 1 tablet by mouth every evening.    . traMADol (ULTRAM) 50 MG tablet Take 50 mg by mouth every 6 (six) hours as needed for moderate pain.     . traZODone (DESYREL) 100 MG tablet Take 100 mg by mouth at bedtime.    Marland Kitchen tiZANidine (ZANAFLEX) 2 MG tablet Take 1 tablet (2 mg total) by mouth every 6 (six) hours as needed. (Patient not taking: Reported on 10/17/2019) 60 tablet 0    No results found for this or any previous visit (from the past 48 hour(s)). No  results found.  Review of Systems  Constitutional: Negative.   HENT: Negative.   Respiratory: Negative.   Cardiovascular: Negative.   Gastrointestinal: Negative.   Endocrine: Negative.   Genitourinary: Negative.   Musculoskeletal: Positive for back pain.  Allergic/Immunologic: Negative.   Neurological: Positive for weakness and light-headedness.  Hematological: Negative.   Psychiatric/Behavioral: Negative.     Blood pressure 136/80, pulse 65, temperature 98.1 F (36.7 C), temperature source Oral, resp. rate 18, height 6\' 1"  (1.854 m), weight 112.8 kg, SpO2 97 %. Physical Exam  Constitutional: He is oriented to person, place, and time. He appears well-developed and well-nourished.  HENT:  Head: Normocephalic and atraumatic.  Eyes: Pupils are equal, round, and reactive to light. Conjunctivae and EOM  are normal.  Cardiovascular: Normal rate and regular rhythm.  Respiratory: Effort normal and breath sounds normal.  GI: Soft. Bowel sounds are normal.  Musculoskeletal:     Cervical back: Normal range of motion and neck supple.     Comments: Positive straight leg raising in either lower extremity at 30 degrees Patrick's maneuver is negative bilaterally sensation is diminished over the dorsum of the right leg.  Tibialis anterior is slightly weak at 4 out of 5 on the right side.  Absent deep tendon reflexes in the patella and the Achilles.  Upper extremity strength and reflexes normal as is the cranial nerve examination.  Neurological: He is alert and oriented to person, place, and time.  Skin: Skin is warm and dry.  Psychiatric: He has a normal mood and affect. His behavior is normal. Judgment and thought content normal.     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  Plan: Decompression and arthrodesis via anterior lumbar interbody technique L4-L5.  , MD 10/25/2019, 7:32 AM

## 2019-10-25 NOTE — Progress Notes (Signed)
Patient ID: Mark Crawford, male   DOB: Jul 01, 1956, 64 y.o.   MRN: [id number] Vital signs are stable Patient reasonably comfortable postoperatively Has not voided since Foley removed Complains of some left hip pain but motor strength is intact stable

## 2019-10-25 NOTE — Op Note (Signed)
Date of surgery: October 25, 2019 Preoperative diagnosis: Spondylolisthesis L4-L5 with lumbar radiculopathy Postoperative diagnosis: Same Procedure: Anterior lumbar decompression L4-L5 arthrodesis with structural anterior cage and allograft (osteocell) screw fixation of cage. 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  Is been advised regarding surgical decompression and stabilization of the L4-5 joint.  Procedure: The patient was brought to the operating room supine on the stretcher.  He was placed on the table in supine position after the smooth induction of general tracheal anesthesia L4-L5 was localized and the skin was marked the skin was prepped with alcohol DuraPrep and draped in a sterile fashion and Dr. Arbie Cookey performed an anterior retroperitoneal exposure to expose L4-L5 the space was marked and verified with fluoroscopy.  I then started my portion of the procedure by opening the anterior longitudinal ligament using a #15 blade combination of curettes and rongeurs were used to evacuate and release a substantial portions of the degenerated L4-5 disc.  This continued in a piecemeal fashion until the region of the posterior longitudinal ligament was reached the ligament was not opened but it was decompressed in the subligamentous fashion removing some degenerated disc from behind the underside of the ligament once all the disc was removed the endplates were  curettaged to relieve any cartilaginous endplate material high-speed drill and a barrel bit was used to smooth any irregular surfaces and once this was completed the interspace was sized for an appropriate size spacer.  It was felt that a 13 mm tall spacer measuring 42 x 30 mm in size with 10 degrees of lordosis would fit best into this interval.  This was filled with ostia cell allograft and then placed into the space under fluoroscopic guidance.  3 screws were placed to secure this with 2 screws being placed into L5 and 1 screw being placed into L4 these were 25 mm in length.  Once the screws were placed there was some ventral bone exposed and additional ostia cell was packed in this area once completed final radiographs were obtained in AP and lateral projection and the retractors were carefully removed.  During this time Dr. Franky Macho assisted with verifying the positioning of the interspace and helping with retraction to place the screws and secure the graft the retractors were removed and the closure was then proceeded with #1 PDS suture in the anterior rectus fascia 2-0 Vicryl in the subcutaneous tissues and 3-0 Vicryl subcuticularly Dermabond was placed on the skin the patient tolerated procedure well is returned to recovery room in stable condition.  Blood loss was less than 100 cc.

## 2019-10-25 NOTE — Anesthesia Procedure Notes (Signed)
Anesthesia Regional Block: TAP block   Pre-Anesthetic Checklist: ,, timeout performed, Correct Patient, Correct Site, Correct Laterality, Correct Procedure, Correct Position, site marked, Risks and benefits discussed,  Surgical consent,  Pre-op evaluation,  At surgeon's request and post-op pain management  Laterality: Left and Right  Prep: chloraprep, alcohol swabs       Needles:  Injection technique: Single-shot  Needle Type: Echogenic Stimulator Needle     Needle Length: 9cm  Needle Gauge: 21     Additional Needles:   Narrative:  Start time: 10/25/2019 10:22 AM End time: 10/25/2019 10:32 AM Injection made incrementally with aspirations every 5 mL.  Performed by: Personally  Anesthesiologist: Arta Bruce, MD  Additional Notes: Monitors applied. Patient under GA. Sterile prep and drape,hand hygiene and sterile gloves were used. Relevant anatomy identified bilaterally.Needle position confirmed.Local anesthetic injected incrementally after negative aspiration. Local anesthetic spread visualized in Transversus Abdominus Plane on both sides. Vascular puncture avoided. No complications. Images printed for medical record.The patient tolerated the procedure well.

## 2019-10-25 NOTE — Anesthesia Postprocedure Evaluation (Signed)
Anesthesia Post Note  Patient: Mark Crawford  Procedure(s) Performed: Lumbar Four-Five  Anterior lumbar interbody fusion (N/A Spine Lumbar) Abdominal Exposure (N/A Abdomen)     Patient location during evaluation: PACU Anesthesia Type: General Level of consciousness: awake and alert Pain management: pain level controlled Vital Signs Assessment: post-procedure vital signs reviewed and stable Respiratory status: spontaneous breathing, nonlabored ventilation, respiratory function stable and patient connected to nasal cannula oxygen Cardiovascular status: blood pressure returned to baseline and stable Postop Assessment: no apparent nausea or vomiting Anesthetic complications: no    Last Vitals:  Vitals:   10/25/19 1056 10/25/19 1142  BP: (!) 146/64 131/68  Pulse: 90 84  Resp: 18 18  Temp: 36.6 C 36.6 C  SpO2: 95% 92%    Last Pain:  Vitals:   10/25/19 1142  TempSrc: Oral  PainSc:                  Nadezhda Pollitt DAVID

## 2019-10-25 NOTE — Anesthesia Preprocedure Evaluation (Signed)
Anesthesia Evaluation  Patient identified by MRN, date of birth, ID band Patient awake    Reviewed: Allergy & Precautions, NPO status , Patient's Chart, lab work & pertinent test results  History of Anesthesia Complications (+) PONV  Airway Mallampati: I  TM Distance: >3 FB Neck ROM: Full    Dental   Pulmonary    Pulmonary exam normal        Cardiovascular Normal cardiovascular exam     Neuro/Psych    GI/Hepatic GERD  Medicated and Controlled,  Endo/Other    Renal/GU      Musculoskeletal   Abdominal   Peds  Hematology   Anesthesia Other Findings   Reproductive/Obstetrics                             Anesthesia Physical Anesthesia Plan  ASA: II  Anesthesia Plan: General   Post-op Pain Management:    Induction: Intravenous  PONV Risk Score and Plan: 3 and Ondansetron, Midazolam, Scopolamine patch - Pre-op and Promethazine  Airway Management Planned:   Additional Equipment:   Intra-op Plan:   Post-operative Plan:   Informed Consent:   Plan Discussed with:   Anesthesia Plan Comments:         Anesthesia Quick Evaluation

## 2019-10-26 ENCOUNTER — Encounter: Payer: Self-pay | Admitting: *Deleted

## 2019-10-26 DIAGNOSIS — M549 Dorsalgia, unspecified: Secondary | ICD-10-CM | POA: Diagnosis not present

## 2019-10-26 DIAGNOSIS — M5116 Intervertebral disc disorders with radiculopathy, lumbar region: Secondary | ICD-10-CM | POA: Diagnosis not present

## 2019-10-26 MED ORDER — METHOCARBAMOL 500 MG PO TABS: 500.0000 mg | ORAL_TABLET | Freq: Four times a day (QID) | ORAL | 3 refills | Status: DC | PRN

## 2019-10-26 MED ORDER — OXYCODONE-ACETAMINOPHEN 5-325 MG PO TABS: 1.0000 | ORAL_TABLET | ORAL | 0 refills | Status: AC | PRN

## 2019-10-26 NOTE — Evaluation (Signed)
Physical Therapy Evaluation and Discharge Patient Details Name: Mark Crawford MRN: [id number] DOB: Sep 15, 1956 Today's Date: 10/26/2019   History of Present Illness  64 yo male s/p ALIF L4-5 PMH arthritis, Gerd, hx of PE, R arm rod surg, R THR, L TKA, neck surgery,   Clinical Impression  Patient evaluated by Physical Therapy with no further acute PT needs identified. All education has been completed and the patient has no further questions. Pt was able to demonstrate transfers and ambulation with gross modified independence. Wife supportive and present throughout session for education. Pt was educated on precautions, brace application/wearing schedule, appropriate activity progression, and car transfer. See below for any follow-up Physical Therapy or equipment needs. PT is signing off. Thank you for this referral.     Follow Up Recommendations No PT follow up;Supervision - Intermittent    Equipment Recommendations  None recommended by PT    Recommendations for Other Services       Precautions / Restrictions Precautions Precautions: Back Precaution Booklet Issued: Yes (comment) Precaution Comments: Reviewed handout in detail and pt was cued for precautions during functional mobility.  Restrictions Weight Bearing Restrictions: No      Mobility  Bed Mobility               General bed mobility comments: Verbally reviewed log roll technique.   Transfers Overall transfer level: Modified independent Equipment used: None Transfers: Sit to/from Stand Sit to Stand: Supervision         General transfer comment: No assist required. Pt demonstrated proper hand placement on seated surface for safety.   Ambulation/Gait Ambulation/Gait assistance: Modified independent (Device/Increase time) Gait Distance (Feet): 350 Feet Assistive device: None Gait Pattern/deviations: Step-through pattern;Decreased stride length Gait velocity: Mildly decreased Gait velocity interpretation: 1.31  - 2.62 ft/sec, indicative of limited community ambulator General Gait Details: Pt ambulating well with good posture and maintenance of precautions. Gait pattern appears smooth and casual without overt LOB or notable deviations.   Stairs            Wheelchair Mobility    Modified Rankin (Stroke Patients Only)       Balance Overall balance assessment: Mild deficits observed, not formally tested                                           Pertinent Vitals/Pain Pain Assessment: Faces Pain Score: 4  Faces Pain Scale: Hurts a little bit Pain Location: back Pain Descriptors / Indicators: Operative site guarding;Sore Pain Intervention(s): Limited activity within patient's tolerance;Monitored during session;Repositioned    Home Living Family/patient expects to be discharged to:: Private residence Living Arrangements: Spouse/significant other Available Help at Discharge: Family Type of Home: House Home Access: Stairs to enter Entrance Stairs-Rails: None Secretary/administrator of Steps: 2 Home Layout: One level Home Equipment: None Additional Comments: retired a year ago    Prior Function Level of Independence: Independent               Hand Dominance   Dominant Hand: Left    Extremity/Trunk Assessment   Upper Extremity Assessment Upper Extremity Assessment: Overall WFL for tasks assessed    Lower Extremity Assessment Lower Extremity Assessment: Overall WFL for tasks assessed(Mild weakness consistent with pre-op diagnosis)    Cervical / Trunk Assessment Cervical / Trunk Assessment: Other exceptions Cervical / Trunk Exceptions: s/p surgery  Communication   Communication: No difficulties  Cognition  Arousal/Alertness: Awake/alert Behavior During Therapy: WFL for tasks assessed/performed Overall Cognitive Status: Within Functional Limits for tasks assessed                                        General Comments       Exercises     Assessment/Plan    PT Assessment Patent does not need any further PT services  PT Problem List         PT Treatment Interventions      PT Goals (Current goals can be found in the Care Plan section)  Acute Rehab PT Goals Patient Stated Goal: to go home today PT Goal Formulation: All assessment and education complete, DC therapy    Frequency     Barriers to discharge        Co-evaluation               AM-PAC PT "6 Clicks" Mobility  Outcome Measure Help needed turning from your back to your side while in a flat bed without using bedrails?: None Help needed moving from lying on your back to sitting on the side of a flat bed without using bedrails?: None Help needed moving to and from a bed to a chair (including a wheelchair)?: None Help needed standing up from a chair using your arms (e.g., wheelchair or bedside chair)?: None Help needed to walk in hospital room?: None Help needed climbing 3-5 steps with a railing? : None 6 Click Score: 24    End of Session Equipment Utilized During Treatment: Back brace Activity Tolerance: Patient tolerated treatment well Patient left: in chair;with call bell/phone within reach;with family/visitor present Nurse Communication: Mobility status PT Visit Diagnosis: Other symptoms and signs involving the nervous system (R29.898);Pain Pain - part of body: (back)    Time: 3762-8315 PT Time Calculation (min) (ACUTE ONLY): 19 min   Charges:   PT Evaluation $PT Eval Low Complexity: 1 Low          Rolinda Roan, PT, DPT Acute Rehabilitation Services Pager: [phone] Office: [phone]   Thelma Comp 10/26/2019, 12:00 PM

## 2019-10-26 NOTE — Discharge Instructions (Signed)

## 2019-10-26 NOTE — Evaluation (Signed)
Occupational Therapy Evaluation Patient Details Name: Mark Crawford MRN: [id number] DOB: 1955/10/22 Today's Date: 10/26/2019    History of Present Illness 64 yo male s/p ALIF L4-5 PMH arthritis, Gerd, hx of PE, R arm rod surg, R THR, L TKA, neck surgery,    Clinical Impression   Patient evaluated by Occupational Therapy with no further acute OT needs identified. All education has been completed and the patient has no further questions. See below for any follow-up Occupational Therapy or equipment needs. OT to sign off. Thank you for referral.      Follow Up Recommendations  No OT follow up    Equipment Recommendations  None recommended by OT    Recommendations for Other Services       Precautions / Restrictions Precautions Precautions: Back Precaution Comments: back brace, handout present and able to verbalize  precautions Restrictions Weight Bearing Restrictions: No      Mobility Bed Mobility               General bed mobility comments: oob in chair . able to recall education with PT to verbalize  Transfers Overall transfer level: Needs assistance   Transfers: Sit to/from Stand Sit to Stand: Supervision              Balance                                           ADL either performed or assessed with clinical judgement   ADL Overall ADL's : Needs assistance/impaired Eating/Feeding: Independent   Grooming: Wash/dry hands;Wash/dry face;Modified independent   Upper Body Bathing: Modified independent   Lower Body Bathing: Min guard;Sit to/from stand   Upper Body Dressing : Modified independent   Lower Body Dressing: Min guard;Sit to/from stand   Toilet Transfer: Min guard           Functional mobility during ADLs: Min guard General ADL Comments: able to dress for home. extensive education to sit for LB dressing. pt attempting stand despite educatuion and had a planned failed attem.t wife present for all educatuion. wife  asking about visitors at home. advised that if visitors are allowed in home that everyone mask but discouraged visitors due to covid numbers.   Back handout provided and reviewed adls in detail. Pt educated on: clothing between brace, never sleep in brace, set an alarm at night for medication, avoid sitting for long periods of time, correct bed positioning for sleeping, correct sequence for bed mobility, avoiding lifting more than 5 pounds and never wash directly over incision. All education is complete and patient indicates understanding.    Vision Baseline Vision/History: No visual deficits       Perception     Praxis      Pertinent Vitals/Pain Pain Assessment: 0-10 Pain Score: 4  Pain Location: back Pain Descriptors / Indicators: Operative site guarding Pain Intervention(s): Premedicated before session;Repositioned     Hand Dominance Left   Extremity/Trunk Assessment Upper Extremity Assessment Upper Extremity Assessment: Overall WFL for tasks assessed   Lower Extremity Assessment Lower Extremity Assessment: Defer to PT evaluation   Cervical / Trunk Assessment Cervical / Trunk Assessment: Other exceptions(s/p surg)   Communication Communication Communication: No difficulties   Cognition Arousal/Alertness: Awake/alert Behavior During Therapy: WFL for tasks assessed/performed Overall Cognitive Status: Within Functional Limits for tasks assessed  General Comments       Exercises     Shoulder Instructions      Home Living Family/patient expects to be discharged to:: Private residence Living Arrangements: Spouse/significant other Available Help at Discharge: Family Type of Home: House Home Access: Stairs to enter [employer] of Steps: 2 Entrance Stairs-Rails: None Home Layout: One level     Bathroom Shower/Tub: Tub/shower unit;Walk-in shower   Bathroom Toilet: Standard     Home Equipment: None    Additional Comments: retired a year ago      Prior Functioning/Environment Level of Independence: Independent                 OT Problem List:        OT Treatment/Interventions:      OT Goals(Current goals can be found in the care plan section) Acute Rehab OT Goals Patient Stated Goal: to go home today  OT Frequency:     Barriers to D/C:            Co-evaluation              AM-PAC OT "6 Clicks" Daily Activity     Outcome Measure Help from another person eating meals?: None Help from another person taking care of personal grooming?: None Help from another person toileting, which includes using toliet, bedpan, or urinal?: None Help from another person bathing (including washing, rinsing, drying)?: None Help from another person to put on and taking off regular upper body clothing?: None Help from another person to put on and taking off regular lower body clothing?: None 6 Click Score: 24   End of Session Equipment Utilized During Treatment: Back brace Nurse Communication: Mobility status;Precautions  Activity Tolerance: Patient tolerated treatment well Patient left: in chair;with call bell/phone within reach;with family/visitor present  OT Visit Diagnosis: Unsteadiness on feet (R26.81)                Time: 0034-9179 OT Time Calculation (min): 25 min Charges:  OT General Charges $OT Visit: 1 Visit OT Evaluation $OT Eval Moderate Complexity: 1 Mod   Brynn, OTR/L  Acute Rehabilitation Services Pager: [phone] Office: [phone] .   Jeri Modena 10/26/2019, 10:59 AM

## 2019-10-26 NOTE — Progress Notes (Signed)
  Progress Note    10/26/2019 7:54 AM 1 Day Post-Op  Subjective:  S/p exposure for ALIF. Sitting on side of bed and just finished breaskfast. No N, V. Passing flatus. Pain controlled. Voiding spontaneously    Vitals:   10/26/19 0323 10/26/19 0723  BP: (!) 113/57 (!) 108/59  Pulse: 79 70  Resp: 20 18  Temp: 98.3 F (36.8 C) 98.4 F (36.9 C)  SpO2: 90% 92%    Physical Exam: Cardiac:  RRR Lungs:  Non-labored Incisions:  Transverse lower abd inscision is well approximated without signs of infection. Mild per-incisional ecchymosis.  Extremities:  Moving all extremites well Abdomen:  Soft, active BS  CBC    Component Value Date/Time   WBC 6.8 10/22/2019 0943   RBC 5.30 10/22/2019 0943   HGB 16.3 10/22/2019 0943   HCT 48.9 10/22/2019 0943   PLT 203 10/22/2019 0943   MCV 92.3 10/22/2019 0943   MCH 30.8 10/22/2019 0943   MCHC 33.3 10/22/2019 0943   RDW 12.3 10/22/2019 0943   LYMPHSABS 1.3 07/04/2018 1030   MONOABS 0.5 07/04/2018 1030   EOSABS 0.1 07/04/2018 1030   BASOSABS 0.0 07/04/2018 1030    BMET    Component Value Date/Time   NA 139 10/22/2019 0943   K 3.9 10/22/2019 0943   CL 104 10/22/2019 0943   CO2 27 10/22/2019 0943   GLUCOSE 95 10/22/2019 0943   BUN 16 10/22/2019 0943   CREATININE 1.16 10/22/2019 0943   CALCIUM 9.3 10/22/2019 0943   GFRNONAA >60 10/22/2019 0943   GFRAA >60 10/22/2019 0943     Intake/Output Summary (Last 24 hours) at 10/26/2019 0754 Last data filed at 10/25/2019 1726 Gross per 24 hour  Intake 1640 ml  Output 450 ml  Net 1190 ml    HOSPITAL MEDICATIONS Scheduled Meds: . docusate sodium  100 mg Oral BID  . famotidine  40 mg Oral QPM  . loratadine  10 mg Oral Daily  . montelukast  10 mg Oral Daily  . pantoprazole  40 mg Oral Daily  . rosuvastatin  20 mg Oral Daily  . senna  1 tablet Oral BID  . sodium chloride flush  3 mL Intravenous Q12H  . traZODone  100 mg Oral QHS   Continuous Infusions: . sodium chloride    .  lactated ringers    . methocarbamol (ROBAXIN) IV     PRN Meds:.acetaminophen **OR** acetaminophen, alum & mag hydroxide-simeth, bisacodyl, menthol-cetylpyridinium **OR** phenol, methocarbamol **OR** methocarbamol (ROBAXIN) IV, morphine injection, ondansetron **OR** ondansetron (ZOFRAN) IV, polyethylene glycol, sodium chloride flush, sodium phosphate, traMADol  Assessment:  64 y.o. male is s/p:  S/p exposure for ALIR  1 Day Post-Op  Plan: -As per primary team    Wendi Maya, PA-C [facility] [phone] 10/26/2019  7:54 AM

## 2019-10-26 NOTE — Progress Notes (Signed)
Patient is discharged from room 3C03 at this time. Alert and in stable condition. IV site d/c'[d and instructions read to patient and spouse with understanding verbalized. Left unit via wheelchair with all belongings at side. 

## 2019-10-26 NOTE — Discharge Summary (Signed)
Physician Discharge Summary  Patient ID: Mark Crawford MRN: [id number] DOB/AGE: Aug 06, 1956 64 y.o.  Admit date: 10/25/2019 Discharge date: 10/26/2019  Admission Diagnoses: Spondylolisthesis L4-L5 with lumbar radiculopathy  Discharge Diagnoses: Spondylolisthesis L4-L5 with lumbar radiculopathy Active Problems:   Spondylolisthesis of lumbar region   Discharged Condition: good  Hospital Course: Patient was admitted to undergo surgical decompression which he tolerated well he is ambulatory incision is clean and dry  Consults: None  Significant Diagnostic Studies: None  Treatments: surgery: Anterior lumbar interbody decompression arthrodesis with titanium spacer and allograft  Discharge Exam: Blood pressure (!) 108/59, pulse 70, temperature 98.4 F (36.9 C), temperature source Oral, resp. rate 18, height 6\' 1"  (1.854 m), weight 112.8 kg, SpO2 92 %. Incision is clean and dry motor function is intact Station and gait are normal  Disposition: Discharge disposition: 01-Home or Self Care       Discharge Instructions    Call MD for:  redness, tenderness, or signs of infection (pain, swelling, redness, odor or green/yellow discharge around incision site)   Complete by: As directed    Call MD for:  severe uncontrolled pain   Complete by: As directed    Call MD for:  temperature >100.4   Complete by: As directed    Diet - low sodium heart healthy   Complete by: As directed    Discharge instructions   Complete by: As directed    Okay to shower. Do not apply salves or appointments to incision. No heavy lifting with the upper extremities greater than 15 pounds. May resume driving when not requiring pain medication and patient feels comfortable with doing so.   Incentive spirometry RT   Complete by: As directed    Increase activity slowly   Complete by: As directed         Signed: 10/26/2019, 8:31 AM

## 2019-10-30 MED FILL — Sodium Chloride IV Soln 0.9%: INTRAVENOUS | Qty: 1000 | Status: AC

## 2019-10-30 MED FILL — Heparin Sodium (Porcine) Inj 1000 Unit/ML: INTRAMUSCULAR | Qty: 30 | Status: AC

## 2020-01-09 ENCOUNTER — Other Ambulatory Visit (HOSPITAL_COMMUNITY): Payer: Self-pay | Admitting: Neurological Surgery

## 2020-01-09 DIAGNOSIS — M79604 Pain in right leg: Secondary | ICD-10-CM

## 2020-01-09 DIAGNOSIS — M79605 Pain in left leg: Secondary | ICD-10-CM

## 2020-01-10 ENCOUNTER — Ambulatory Visit (INDEPENDENT_AMBULATORY_CARE_PROVIDER_SITE_OTHER)
Admission: RE | Admit: 2020-01-10 | Discharge: 2020-01-10 | Disposition: A | Discharge status: Home / Self Care | Payer: Medicare Other | Source: Ambulatory Visit | Attending: Neurological Surgery | Admitting: Neurological Surgery

## 2020-01-10 ENCOUNTER — Ambulatory Visit (HOSPITAL_COMMUNITY)
Admission: RE | Admit: 2020-01-10 | Discharge: 2020-01-10 | Disposition: A | Discharge status: Home / Self Care | Payer: Medicare Other | Source: Ambulatory Visit | Attending: Surgery | Admitting: Surgery

## 2020-01-10 ENCOUNTER — Ambulatory Visit (HOSPITAL_COMMUNITY): Admission: RE | Admit: 2020-01-10 | Payer: Medicare Other | Source: Ambulatory Visit

## 2020-01-10 ENCOUNTER — Other Ambulatory Visit: Payer: Self-pay

## 2020-01-10 DIAGNOSIS — M79605 Pain in left leg: Secondary | ICD-10-CM | POA: Diagnosis present

## 2020-01-10 DIAGNOSIS — M79604 Pain in right leg: Secondary | ICD-10-CM

## 2021-07-09 ENCOUNTER — Other Ambulatory Visit: Payer: Self-pay | Admitting: Neurological Surgery

## 2021-07-09 ENCOUNTER — Other Ambulatory Visit (HOSPITAL_COMMUNITY): Payer: Self-pay | Admitting: Neurological Surgery

## 2021-07-09 DIAGNOSIS — M47816 Spondylosis without myelopathy or radiculopathy, lumbar region: Secondary | ICD-10-CM

## 2021-07-22 ENCOUNTER — Ambulatory Visit (HOSPITAL_COMMUNITY)
Admission: RE | Admit: 2021-07-22 | Discharge: 2021-07-22 | Disposition: A | Discharge status: Home / Self Care | Payer: Medicare Other | Source: Ambulatory Visit | Attending: Neurological Surgery | Admitting: Neurological Surgery

## 2021-07-22 ENCOUNTER — Other Ambulatory Visit: Payer: Self-pay

## 2021-07-22 DIAGNOSIS — Z981 Arthrodesis status: Secondary | ICD-10-CM | POA: Insufficient documentation

## 2021-07-22 DIAGNOSIS — M48061 Spinal stenosis, lumbar region without neurogenic claudication: Secondary | ICD-10-CM | POA: Diagnosis not present

## 2021-07-22 DIAGNOSIS — M4726 Other spondylosis with radiculopathy, lumbar region: Secondary | ICD-10-CM | POA: Insufficient documentation

## 2021-07-22 DIAGNOSIS — M47816 Spondylosis without myelopathy or radiculopathy, lumbar region: Secondary | ICD-10-CM

## 2021-07-22 DIAGNOSIS — N200 Calculus of kidney: Secondary | ICD-10-CM | POA: Diagnosis not present

## 2021-07-22 MED ORDER — HYDROCODONE-ACETAMINOPHEN 5-325 MG PO TABS
1.0000 | ORAL_TABLET | ORAL | Status: DC | PRN
  Administered 2021-07-22: 1 via ORAL
  Filled 2021-07-22: qty 1

## 2021-07-22 MED ORDER — ONDANSETRON HCL 4 MG/2ML IJ SOLN: 4.0000 mg | Freq: Four times a day (QID) | INTRAMUSCULAR | Status: DC | PRN

## 2021-07-22 MED ORDER — IOHEXOL 180 MG/ML  SOLN
20.0000 mL | Freq: Once | INTRAMUSCULAR | Status: AC | PRN
  Administered 2021-07-22: 12 mL via INTRATHECAL

## 2021-07-22 MED ORDER — DIAZEPAM 5 MG PO TABS
10.0000 mg | ORAL_TABLET | Freq: Once | ORAL | Status: AC
Start: 2021-07-22 — End: 2021-07-22
  Administered 2021-07-22: 10 mg via ORAL
  Filled 2021-07-22: qty 2

## 2021-07-22 MED ORDER — LIDOCAINE HCL (PF) 1 % IJ SOLN
5.0000 mL | Freq: Once | INTRAMUSCULAR | Status: AC
  Administered 2021-07-22: 5 mL via INTRADERMAL

## 2021-07-22 NOTE — Procedures (Signed)
Date of procedure: 07/22/2021 Preoperative diagnosis: Low back pain lumbar radiculopathy status post decompression arthrodesis L4-L5 Via anterior lumbar interbody arthrodesis. Postoperative diagnosis: Same Procedure: Lumbar myelogram Indications: Mr. Mark Crawford is a 65 year old individual who several years ago underwent anterior lumbar interbody arthrodesis at the next to the last level in the lumbar spine.  This appears to correlate with what would conventionally be called L4-L5 however by his counting position it appears to be L5-S1 as there is an extra disc at the S1-S2 level.  The patient did well for a while and then developed lumbar radicular pain.  Because of the presence of the metallic artifact and MRI makes it difficult to determine how much and what degree of stenosis there may be in the lateral recesses.  A myelogram and postmyelogram CAT scan with flexion-extension views is now being performed CT imaging will also be performed to evaluate the pathoanatomy in this level.  Pre op Dx: Lumbar spondylosis lumbar radiculopathy history of arthrodesis L4-L5. Post op Dx: Same Procedure: Lumbar myelogram Surgeon: Lehua Flores Puncture level: L2-3 Fluid color: Clear colorless Injection: Iohexol 180, 12 mL Findings: Relative areas of lateral recess stenosis at L4-5 and possibly L3-4.  Question of pseudoarthrosis L4-5.  Further evaluation with CT scanning.

## 2021-07-24 ENCOUNTER — Other Ambulatory Visit: Payer: Self-pay | Admitting: Neurological Surgery

## 2021-10-16 NOTE — Progress Notes (Signed)
Surgical Instructions    Your procedure is scheduled on Tuesday, February, 14th, 2023.   Report to [facility] Main Entrance "A" at 05:30 A.M., then check in with the Admitting office.  Call this number if you have problems the morning of surgery:  [phone]   If you have any questions prior to your surgery date call [phone]: Open Monday-Friday 8am-4pm    Remember:  Do not eat after midnight the night before your surgery  You may drink clear liquids until 04:30 the morning of your surgery.   Clear liquids allowed are: Water, Non-Citrus Juices (without pulp), Carbonated Beverages, Clear Tea, Black Coffee ONLY (NO MILK, CREAM OR POWDERED CREAMER of any kind), and Gatorade    Take these medicines the morning of surgery with A SIP OF WATER:  omeprazole (PRILOSEC)  rosuvastatin (CRESTOR)   As of today, STOP taking any Aspirin (unless otherwise instructed by your surgeon) Aleve, Naproxen, Ibuprofen, Motrin, Advil, Goody's, BC's, all herbal medications, fish oil, and all vitamins.   After your COVID test   You are not required to quarantine however you are required to wear a well-fitting mask when you are out and around people not in your household.  If your mask becomes wet or soiled, replace with a new one.  Wash your hands often with soap and water for 20 seconds or clean your hands with an alcohol-based hand sanitizer that contains at least 60% alcohol.  Do not share personal items.  Notify your provider: if you are in close contact with someone who has COVID  or if you develop a fever of 100.4 or greater, sneezing, cough, sore throat, shortness of breath or body aches.    The day of surgery:  Do not wear jewelry  Do not wear lotions, powders, colognes, or deodorant. Men may shave face and neck. Do not bring valuables to the hospital.              [facility] is not responsible for any belongings or valuables.  Do NOT Smoke (Tobacco/Vaping)  24 hours prior to  your procedure  If you use a CPAP at night, you may bring your mask for your overnight stay.   Contacts, glasses, hearing aids, dentures or partials may not be worn into surgery, please bring cases for these belongings   For patients admitted to the hospital, discharge time will be determined by your treatment team.   Patients discharged the day of surgery will not be allowed to drive home, and someone needs to stay with them for 24 hours.  NO VISITORS WILL BE ALLOWED IN PRE-OP WHERE PATIENTS ARE PREPPED FOR SURGERY.  ONLY 1 SUPPORT PERSON MAY BE PRESENT IN THE WAITING ROOM WHILE YOU ARE IN SURGERY.  IF YOU ARE TO BE ADMITTED, ONCE YOU ARE IN YOUR ROOM YOU WILL BE ALLOWED TWO (2) VISITORS. 1 (ONE) VISITOR MAY STAY OVERNIGHT BUT MUST ARRIVE TO THE ROOM BY 8pm.  Minor children may have two parents present. Special consideration for safety and communication needs will be reviewed on a case by case basis.  Special instructions:    Oral Hygiene is also important to reduce your risk of infection.  Remember - BRUSH YOUR TEETH THE MORNING OF SURGERY WITH YOUR REGULAR TOOTHPASTE   [facility]- Preparing For Surgery  Before surgery, you can play an important role. Because skin is not sterile, your skin needs to be as free of germs as possible. You can reduce the number of germs on your skin  by washing with CHG (chlorahexidine gluconate) Soap before surgery.  CHG is an antiseptic cleaner which kills germs and bonds with the skin to continue killing germs even after washing.     Please do not use if you have an allergy to CHG or antibacterial soaps. If your skin becomes reddened/irritated stop using the CHG.  Do not shave (including legs and underarms) for at least 48 hours prior to first CHG shower. It is OK to shave your face.  Please follow these instructions carefully.     Shower the NIGHT BEFORE SURGERY and the MORNING OF SURGERY with CHG Soap.   If you chose to wash your hair, wash your hair  first as usual with your normal shampoo. After you shampoo, rinse your hair and body thoroughly to remove the shampoo.  Then [employer] and genitals (private parts) with your normal soap and rinse thoroughly to remove soap.  After that Use CHG Soap as you would any other liquid soap. You can apply CHG directly to the skin and wash gently with a scrungie or a clean washcloth.   Apply the CHG Soap to your body ONLY FROM THE NECK DOWN.  Do not use on open wounds or open sores. Avoid contact with your eyes, ears, mouth and genitals (private parts). Wash Face and genitals (private parts)  with your normal soap.   Wash thoroughly, paying special attention to the area where your surgery will be performed.  Thoroughly rinse your body with warm water from the neck down.  DO NOT shower/wash with your normal soap after using and rinsing off the CHG Soap.  Pat yourself dry with a CLEAN TOWEL.  Wear CLEAN PAJAMAS to bed the night before surgery  Place CLEAN SHEETS on your bed the night before your surgery  DO NOT SLEEP WITH PETS.   Day of Surgery:  Take a shower with CHG soap. Wear Clean/Comfortable clothing the morning of surgery Do not apply any deodorants/lotions.   Remember to brush your teeth WITH YOUR REGULAR TOOTHPASTE.   Please read over the following fact sheets that you were given.

## 2021-10-19 ENCOUNTER — Inpatient Hospital Stay (HOSPITAL_COMMUNITY)
Admission: RE | Admit: 2021-10-19 | Discharge: 2021-10-19 | Disposition: A | Discharge status: Home / Self Care | Payer: Medicare Other | Source: Ambulatory Visit

## 2021-11-12 NOTE — Progress Notes (Signed)
Surgical Instructions    Your procedure is scheduled on 11/17/21.  Report to [facility] Main Entrance "A" at 10:40 A.M., then check in with the Admitting office.  Call this number if you have problems the morning of surgery:  [phone]   If you have any questions prior to your surgery date call [phone]: Open Monday-Friday 8am-4pm    Remember:  Do not eat after midnight the night before your surgery  You may drink clear liquids until 9:40am the morning of your surgery.   Clear liquids allowed are: Water, Non-Citrus Juices (without pulp), Carbonated Beverages, Clear Tea, Black Coffee ONLY (NO MILK, CREAM OR POWDERED CREAMER of any kind), and Gatorade    Take these medicines the morning of surgery with A SIP OF WATER:  omeprazole (PRILOSEC) oxyCODONE-acetaminophen (PERCOCET/ROXICET) if needed rosuvastatin (CRESTOR)  As of today, STOP taking any Aspirin (unless otherwise instructed by your surgeon) meloxicam (MOBIC), Aleve, Naproxen, Ibuprofen, Motrin, Advil, Goody's, BC's, all herbal medications, fish oil, and all vitamins.   Do not wear jewelry or makeup Do not wear lotions, powders, perfumes/colognes, or deodorant. Do not shave 48 hours prior to surgery.  Men may shave face and neck. Do not bring valuables to the hospital. Do not wear nail polish, gel polish, artificial nails, or any other type of covering on natural nails (fingers and toes) If you have artificial nails or gel coating that need to be removed by a nail salon, please have this removed prior to surgery. Artificial nails or gel coating may interfere with anesthesia's ability to adequately monitor your vital signs.  [facility] is not responsible for any belongings or valuables. .   Do NOT Smoke (Tobacco/Vaping)  24 hours prior to your procedure  If you use a CPAP at night, you may bring your mask for your overnight stay.   Contacts, glasses, hearing aids, dentures or partials may not be worn into surgery,  please bring cases for these belongings   For patients admitted to the hospital, discharge time will be determined by your treatment team.   Patients discharged the day of surgery will not be allowed to drive home, and someone needs to stay with them for 24 hours.  NO VISITORS WILL BE ALLOWED IN PRE-OP WHERE PATIENTS ARE PREPPED FOR SURGERY.  ONLY 1 SUPPORT PERSON MAY BE PRESENT IN THE WAITING ROOM WHILE YOU ARE IN SURGERY.  IF YOU ARE TO BE ADMITTED, ONCE YOU ARE IN YOUR ROOM YOU WILL BE ALLOWED TWO (2) VISITORS. 1 (ONE) VISITOR MAY STAY OVERNIGHT BUT MUST ARRIVE TO THE ROOM BY 8pm.  Minor children may have two parents present. Special consideration for safety and communication needs will be reviewed on a case by case basis.  Special instructions:    Oral Hygiene is also important to reduce your risk of infection.  Remember - BRUSH YOUR TEETH THE MORNING OF SURGERY WITH YOUR REGULAR TOOTHPASTE   - Preparing For Surgery  Before surgery, you can play an important role. Because skin is not sterile, your skin needs to be as free of germs as possible. You can reduce the number of germs on your skin by washing with CHG (chlorahexidine gluconate) Soap before surgery.  CHG is an antiseptic cleaner which kills germs and bonds with the skin to continue killing germs even after washing.     Please do not use if you have an allergy to CHG or antibacterial soaps. If your skin becomes reddened/irritated stop using the CHG.  Do not shave (  including legs and underarms) for at least 48 hours prior to first CHG shower. It is OK to shave your face.  Please follow these instructions carefully.     Shower the NIGHT BEFORE SURGERY and the MORNING OF SURGERY with CHG Soap.   If you chose to wash your hair, wash your hair first as usual with your normal shampoo. After you shampoo, rinse your hair and body thoroughly to remove the shampoo.  Then [employer] and genitals (private parts) with your normal  soap and rinse thoroughly to remove soap.  After that Use CHG Soap as you would any other liquid soap. You can apply CHG directly to the skin and wash gently with a scrungie or a clean washcloth.   Apply the CHG Soap to your body ONLY FROM THE NECK DOWN.  Do not use on open wounds or open sores. Avoid contact with your eyes, ears, mouth and genitals (private parts). Wash Face and genitals (private parts)  with your normal soap.   Wash thoroughly, paying special attention to the area where your surgery will be performed.  Thoroughly rinse your body with warm water from the neck down.  DO NOT shower/wash with your normal soap after using and rinsing off the CHG Soap.  Pat yourself dry with a CLEAN TOWEL.  Wear CLEAN PAJAMAS to bed the night before surgery  Place CLEAN SHEETS on your bed the night before your surgery  DO NOT SLEEP WITH PETS.   Day of Surgery:  Take a shower with CHG soap. Wear Clean/Comfortable clothing the morning of surgery Do not apply any deodorants/lotions.   Remember to brush your teeth WITH YOUR REGULAR TOOTHPASTE.    COVID testing  If you are going to stay overnight or be admitted after your procedure/surgery and require a pre-op COVID test, please follow these instructions after your COVID test   You are not required to quarantine however you are required to wear a well-fitting mask when you are out and around people not in your household.  If your mask becomes wet or soiled, replace with a new one.  Wash your hands often with soap and water for 20 seconds or clean your hands with an alcohol-based hand sanitizer that contains at least 60% alcohol.  Do not share personal items.  Notify your provider: if you are in close contact with someone who has COVID  or if you develop a fever of 100.4 or greater, sneezing, cough, sore throat, shortness of breath or body aches.    Please read over the following fact sheets that you were given.

## 2021-11-13 ENCOUNTER — Encounter (HOSPITAL_COMMUNITY): Payer: Self-pay

## 2021-11-13 ENCOUNTER — Encounter (HOSPITAL_COMMUNITY)
Admission: RE | Admit: 2021-11-13 | Discharge: 2021-11-13 | Disposition: A | Discharge status: Home / Self Care | Payer: Medicare Other | Source: Ambulatory Visit | Attending: Neurological Surgery | Admitting: Neurological Surgery

## 2021-11-13 ENCOUNTER — Other Ambulatory Visit: Payer: Self-pay

## 2021-11-13 VITALS — BP 137/71 | HR 78 | Temp 98.3°F | Resp 18 | Ht 73.0 in | Wt 252.1 lb

## 2021-11-13 DIAGNOSIS — E785 Hyperlipidemia, unspecified: Secondary | ICD-10-CM | POA: Diagnosis not present

## 2021-11-13 DIAGNOSIS — Z01818 Encounter for other preprocedural examination: Secondary | ICD-10-CM | POA: Insufficient documentation

## 2021-11-13 LAB — CBC
HCT: 45.2 % (ref 39.0–52.0)
Hemoglobin: 15.1 g/dL (ref 13.0–17.0)
MCH: 30.3 pg (ref 26.0–34.0)
MCHC: 33.4 g/dL (ref 30.0–36.0)
MCV: 90.8 fL (ref 80.0–100.0)
Platelets: 186 10*3/uL (ref 150–400)
RBC: 4.98 MIL/uL (ref 4.22–5.81)
RDW: 12.3 % (ref 11.5–15.5)
WBC: 5.5 10*3/uL (ref 4.0–10.5)
nRBC: 0 % (ref 0.0–0.2)

## 2021-11-13 LAB — TYPE AND SCREEN
ABO/RH(D): A POS
Antibody Screen: NEGATIVE

## 2021-11-13 LAB — BASIC METABOLIC PANEL
Anion gap: 6 (ref 5–15)
BUN: 20 mg/dL (ref 8–23)
CO2: 27 mmol/L (ref 22–32)
Calcium: 8.9 mg/dL (ref 8.9–10.3)
Chloride: 106 mmol/L (ref 98–111)
Creatinine, Ser: 1.3 mg/dL — ABNORMAL HIGH (ref 0.61–1.24)
GFR, Estimated: >60 mL/min (ref >60)
Glucose, Bld: 100 mg/dL — ABNORMAL HIGH (ref 70–99)
Potassium: 4.5 mmol/L (ref 3.5–5.1)
Sodium: 139 mmol/L (ref 135–145)

## 2021-11-13 LAB — SURGICAL PCR SCREEN
MRSA, PCR: NEGATIVE
Staphylococcus aureus: NEGATIVE

## 2021-11-13 NOTE — Progress Notes (Addendum)
PCP - Pradeep K. Pradhan MD Cardiologist - none  PPM/ICD - denies Device Orders -  Rep Notified -   Chest x-ray - 07/04/18 EKG - 11/13/21 Stress Test - no ECHO - no Cardiac Cath - no  Sleep Study - no CPAP -   Fasting Blood Sugar - n/a Checks Blood Sugar _____ times a day  Blood Thinner Instructions:n/a Aspirin Instructions:n/a  ERAS Protcol -yes- clear liquids until 0940 PRE-SURGERY Ensure or G2- no  COVID TEST- No. Patient tested positive on September 24, 2021 at his PCP's office. He denies Covid symptoms today. Positive result in Care Everywhere found in "documents".   Anesthesia review: no  Patient denies shortness of breath, fever, cough and chest pain at PAT appointment   All instructions explained to the patient, with a verbal understanding of the material. Patient agrees to go over the instructions while at home for a better understanding. Patient also instructed to wear a mask in public until his surgery.  The opportunity to ask questions was provided.

## 2021-11-17 ENCOUNTER — Inpatient Hospital Stay (HOSPITAL_COMMUNITY): Payer: Medicare Other

## 2021-11-17 ENCOUNTER — Inpatient Hospital Stay (HOSPITAL_COMMUNITY): Payer: Medicare Other | Admitting: Anesthesiology

## 2021-11-17 ENCOUNTER — Observation Stay (HOSPITAL_COMMUNITY)
Admission: RE | Admit: 2021-11-17 | Discharge: 2021-11-18 | Disposition: A | Discharge status: Home / Self Care | Payer: Medicare Other | Attending: Neurological Surgery | Admitting: Neurological Surgery

## 2021-11-17 ENCOUNTER — Other Ambulatory Visit: Payer: Self-pay

## 2021-11-17 ENCOUNTER — Encounter (HOSPITAL_COMMUNITY)
Admission: RE | Disposition: A | Discharge status: Home / Self Care | Payer: Self-pay | Source: Home / Self Care | Attending: Neurological Surgery

## 2021-11-17 DIAGNOSIS — M5416 Radiculopathy, lumbar region: Secondary | ICD-10-CM | POA: Insufficient documentation

## 2021-11-17 DIAGNOSIS — Z96641 Presence of right artificial hip joint: Secondary | ICD-10-CM | POA: Insufficient documentation

## 2021-11-17 DIAGNOSIS — M4316 Spondylolisthesis, lumbar region: Secondary | ICD-10-CM | POA: Insufficient documentation

## 2021-11-17 DIAGNOSIS — Z419 Encounter for procedure for purposes other than remedying health state, unspecified: Secondary | ICD-10-CM

## 2021-11-17 DIAGNOSIS — M48062 Spinal stenosis, lumbar region with neurogenic claudication: Principal | ICD-10-CM | POA: Insufficient documentation

## 2021-11-17 DIAGNOSIS — Z79899 Other long term (current) drug therapy: Secondary | ICD-10-CM | POA: Insufficient documentation

## 2021-11-17 DIAGNOSIS — M48061 Spinal stenosis, lumbar region without neurogenic claudication: Secondary | ICD-10-CM | POA: Diagnosis present

## 2021-11-17 DIAGNOSIS — Z981 Arthrodesis status: Secondary | ICD-10-CM | POA: Insufficient documentation

## 2021-11-17 SURGERY — POSTERIOR LUMBAR FUSION 1 LEVEL
Anesthesia: General | Site: Back

## 2021-11-17 MED ORDER — CEFAZOLIN SODIUM-DEXTROSE 2-4 GM/100ML-% IV SOLN
2.0000 g | Freq: Three times a day (TID) | INTRAVENOUS | Status: AC
  Administered 2021-11-17 – 2021-11-18 (×2): 2 g via INTRAVENOUS
  Filled 2021-11-17 (×2): qty 100

## 2021-11-17 MED ORDER — LIDOCAINE 2% (20 MG/ML) 5 ML SYRINGE
INTRAMUSCULAR | Status: DC | PRN
  Administered 2021-11-17: 100 mg via INTRAVENOUS

## 2021-11-17 MED ORDER — THROMBIN 5000 UNITS EX SOLR
OROMUCOSAL | Status: DC | PRN
  Administered 2021-11-17: 5 mL via TOPICAL

## 2021-11-17 MED ORDER — PHENYLEPHRINE HCL-NACL 20-0.9 MG/250ML-% IV SOLN
INTRAVENOUS | Status: DC | PRN
  Administered 2021-11-17: 20 ug/min via INTRAVENOUS

## 2021-11-17 MED ORDER — DEXAMETHASONE SODIUM PHOSPHATE 10 MG/ML IJ SOLN
INTRAMUSCULAR | Status: AC
  Filled 2021-11-17: qty 1

## 2021-11-17 MED ORDER — FENTANYL CITRATE (PF) 250 MCG/5ML IJ SOLN
INTRAMUSCULAR | Status: AC
  Filled 2021-11-17: qty 5

## 2021-11-17 MED ORDER — LIDOCAINE-EPINEPHRINE 1 %-1:100000 IJ SOLN
INTRAMUSCULAR | Status: DC | PRN
  Administered 2021-11-17: 5 mL

## 2021-11-17 MED ORDER — PANTOPRAZOLE SODIUM 40 MG PO TBEC
80.0000 mg | DELAYED_RELEASE_TABLET | Freq: Every day | ORAL | Status: DC
  Administered 2021-11-18: 80 mg via ORAL
  Filled 2021-11-17: qty 2

## 2021-11-17 MED ORDER — DOCUSATE SODIUM 100 MG PO CAPS
100.0000 mg | ORAL_CAPSULE | Freq: Two times a day (BID) | ORAL | Status: DC
  Administered 2021-11-17 – 2021-11-18 (×2): 100 mg via ORAL
  Filled 2021-11-17 (×2): qty 1

## 2021-11-17 MED ORDER — MIDAZOLAM HCL 2 MG/2ML IJ SOLN
INTRAMUSCULAR | Status: AC
  Filled 2021-11-17: qty 2

## 2021-11-17 MED ORDER — ACETAMINOPHEN 325 MG PO TABS: 650.0000 mg | ORAL_TABLET | ORAL | Status: DC | PRN

## 2021-11-17 MED ORDER — THROMBIN 20000 UNITS EX SOLR
CUTANEOUS | Status: AC
  Filled 2021-11-17: qty 20000

## 2021-11-17 MED ORDER — PHENOL 1.4 % MT LIQD: 1.0000 | OROMUCOSAL | Status: DC | PRN

## 2021-11-17 MED ORDER — SODIUM CHLORIDE 0.9 % IV SOLN: 250.0000 mL | INTRAVENOUS | Status: DC

## 2021-11-17 MED ORDER — ONDANSETRON HCL 4 MG/2ML IJ SOLN
INTRAMUSCULAR | Status: DC | PRN
  Administered 2021-11-17: 4 mg via INTRAVENOUS

## 2021-11-17 MED ORDER — LACTATED RINGERS IV SOLN: INTRAVENOUS | Status: DC

## 2021-11-17 MED ORDER — THROMBIN 5000 UNITS EX SOLR
CUTANEOUS | Status: AC
  Filled 2021-11-17: qty 5000

## 2021-11-17 MED ORDER — ROCURONIUM BROMIDE 10 MG/ML (PF) SYRINGE
PREFILLED_SYRINGE | INTRAVENOUS | Status: AC
  Filled 2021-11-17: qty 10

## 2021-11-17 MED ORDER — SODIUM CHLORIDE 0.9% FLUSH: 3.0000 mL | INTRAVENOUS | Status: DC | PRN

## 2021-11-17 MED ORDER — METHOCARBAMOL 500 MG PO TABS
500.0000 mg | ORAL_TABLET | Freq: Four times a day (QID) | ORAL | Status: DC | PRN
Start: 2021-11-17 — End: 2021-11-18
  Administered 2021-11-17 – 2021-11-18 (×2): 500 mg via ORAL
  Filled 2021-11-17 (×2): qty 1

## 2021-11-17 MED ORDER — FAMOTIDINE 20 MG PO TABS
20.0000 mg | ORAL_TABLET | Freq: Every evening | ORAL | Status: DC
  Administered 2021-11-17: 20 mg via ORAL
  Filled 2021-11-17: qty 1

## 2021-11-17 MED ORDER — FLEET ENEMA 7-19 GM/118ML RE ENEM: 1.0000 | ENEMA | Freq: Once | RECTAL | Status: DC | PRN

## 2021-11-17 MED ORDER — METHOCARBAMOL 1000 MG/10ML IJ SOLN
500.0000 mg | Freq: Four times a day (QID) | INTRAVENOUS | Status: DC | PRN
  Filled 2021-11-17: qty 5

## 2021-11-17 MED ORDER — ALBUMIN HUMAN 5 % IV SOLN: INTRAVENOUS | Status: DC | PRN

## 2021-11-17 MED ORDER — PREGABALIN 100 MG PO CAPS
100.0000 mg | ORAL_CAPSULE | Freq: Every evening | ORAL | Status: DC
  Administered 2021-11-17: 100 mg via ORAL
  Filled 2021-11-17: qty 1

## 2021-11-17 MED ORDER — ROSUVASTATIN CALCIUM 20 MG PO TABS
20.0000 mg | ORAL_TABLET | Freq: Every day | ORAL | Status: DC
  Administered 2021-11-18: 20 mg via ORAL
  Filled 2021-11-17: qty 1

## 2021-11-17 MED ORDER — SODIUM CHLORIDE 0.9% FLUSH: 3.0000 mL | Freq: Two times a day (BID) | INTRAVENOUS | Status: DC

## 2021-11-17 MED ORDER — DEXAMETHASONE SODIUM PHOSPHATE 10 MG/ML IJ SOLN
INTRAMUSCULAR | Status: DC | PRN
  Administered 2021-11-17: 10 mg via INTRAVENOUS

## 2021-11-17 MED ORDER — FENTANYL CITRATE (PF) 100 MCG/2ML IJ SOLN
25.0000 ug | INTRAMUSCULAR | Status: DC | PRN
  Administered 2021-11-17 (×3): 25 ug via INTRAVENOUS
  Administered 2021-11-17: 50 ug via INTRAVENOUS
  Administered 2021-11-17: 25 ug via INTRAVENOUS

## 2021-11-17 MED ORDER — ONDANSETRON HCL 4 MG/2ML IJ SOLN: 4.0000 mg | Freq: Four times a day (QID) | INTRAMUSCULAR | Status: DC | PRN

## 2021-11-17 MED ORDER — LIDOCAINE-EPINEPHRINE 1 %-1:100000 IJ SOLN
INTRAMUSCULAR | Status: AC
  Filled 2021-11-17: qty 1

## 2021-11-17 MED ORDER — ORAL CARE MOUTH RINSE: 15.0000 mL | Freq: Once | OROMUCOSAL | Status: AC

## 2021-11-17 MED ORDER — ONDANSETRON HCL 4 MG PO TABS: 4.0000 mg | ORAL_TABLET | Freq: Four times a day (QID) | ORAL | Status: DC | PRN

## 2021-11-17 MED ORDER — FENTANYL CITRATE (PF) 100 MCG/2ML IJ SOLN
INTRAMUSCULAR | Status: AC
  Filled 2021-11-17: qty 2

## 2021-11-17 MED ORDER — PROPOFOL 10 MG/ML IV BOLUS
INTRAVENOUS | Status: AC
  Filled 2021-11-17: qty 20

## 2021-11-17 MED ORDER — MORPHINE SULFATE (PF) 2 MG/ML IV SOLN
2.0000 mg | INTRAVENOUS | Status: DC | PRN
  Administered 2021-11-17: 4 mg via INTRAVENOUS
  Filled 2021-11-17: qty 2

## 2021-11-17 MED ORDER — SENNA 8.6 MG PO TABS
1.0000 | ORAL_TABLET | Freq: Two times a day (BID) | ORAL | Status: DC
  Administered 2021-11-17: 8.6 mg via ORAL
  Filled 2021-11-17 (×2): qty 1

## 2021-11-17 MED ORDER — BISACODYL 10 MG RE SUPP: 10.0000 mg | Freq: Every day | RECTAL | Status: DC | PRN

## 2021-11-17 MED ORDER — CEFAZOLIN SODIUM 1 G IJ SOLR
INTRAMUSCULAR | Status: AC
  Filled 2021-11-17: qty 20

## 2021-11-17 MED ORDER — TRAZODONE HCL 100 MG PO TABS
100.0000 mg | ORAL_TABLET | Freq: Every day | ORAL | Status: DC
  Administered 2021-11-17: 100 mg via ORAL
  Filled 2021-11-17: qty 1

## 2021-11-17 MED ORDER — PROMETHAZINE HCL 25 MG/ML IJ SOLN: 6.2500 mg | INTRAMUSCULAR | Status: DC | PRN

## 2021-11-17 MED ORDER — ACETAMINOPHEN 650 MG RE SUPP: 650.0000 mg | RECTAL | Status: DC | PRN

## 2021-11-17 MED ORDER — BUPIVACAINE HCL (PF) 0.5 % IJ SOLN
INTRAMUSCULAR | Status: DC | PRN
  Administered 2021-11-17: 5 mL
  Administered 2021-11-17: 25 mL

## 2021-11-17 MED ORDER — ACETAMINOPHEN 500 MG PO TABS
1000.0000 mg | ORAL_TABLET | Freq: Once | ORAL | Status: AC
  Filled 2021-11-17: qty 2

## 2021-11-17 MED ORDER — 0.9 % SODIUM CHLORIDE (POUR BTL) OPTIME
TOPICAL | Status: DC | PRN
Start: 2021-11-17 — End: 2021-11-17
  Administered 2021-11-17: 1000 mL

## 2021-11-17 MED ORDER — SENNOSIDES-DOCUSATE SODIUM 8.6-50 MG PO TABS: 1.0000 | ORAL_TABLET | Freq: Every evening | ORAL | Status: DC

## 2021-11-17 MED ORDER — CEFAZOLIN SODIUM-DEXTROSE 2-4 GM/100ML-% IV SOLN
2.0000 g | INTRAVENOUS | Status: AC
  Administered 2021-11-17 (×2): 2 g via INTRAVENOUS
  Filled 2021-11-17: qty 100

## 2021-11-17 MED ORDER — CHLORHEXIDINE GLUCONATE 0.12 % MT SOLN
15.0000 mL | Freq: Once | OROMUCOSAL | Status: AC
  Filled 2021-11-17: qty 15

## 2021-11-17 MED ORDER — SCOPOLAMINE 1 MG/3DAYS TD PT72
1.0000 | MEDICATED_PATCH | TRANSDERMAL | Status: DC
  Administered 2021-11-17: 1.5 mg via TRANSDERMAL
  Filled 2021-11-17: qty 1

## 2021-11-17 MED ORDER — BUPIVACAINE HCL (PF) 0.5 % IJ SOLN
INTRAMUSCULAR | Status: AC
  Filled 2021-11-17: qty 30

## 2021-11-17 MED ORDER — ONDANSETRON HCL 4 MG/2ML IJ SOLN
INTRAMUSCULAR | Status: AC
  Filled 2021-11-17: qty 2

## 2021-11-17 MED ORDER — POLYETHYLENE GLYCOL 3350 17 G PO PACK: 17.0000 g | PACK | Freq: Every day | ORAL | Status: DC | PRN

## 2021-11-17 MED ORDER — ACETAMINOPHEN 500 MG PO TABS
ORAL_TABLET | ORAL | Status: AC
  Administered 2021-11-17: 1000 mg via ORAL
  Filled 2021-11-17: qty 2

## 2021-11-17 MED ORDER — AMISULPRIDE (ANTIEMETIC) 5 MG/2ML IV SOLN: 10.0000 mg | Freq: Once | INTRAVENOUS | Status: DC | PRN

## 2021-11-17 MED ORDER — LORATADINE 10 MG PO TABS
10.0000 mg | ORAL_TABLET | Freq: Every day | ORAL | Status: DC
  Administered 2021-11-17 – 2021-11-18 (×2): 10 mg via ORAL
  Filled 2021-11-17 (×2): qty 1

## 2021-11-17 MED ORDER — LIDOCAINE 2% (20 MG/ML) 5 ML SYRINGE
INTRAMUSCULAR | Status: AC
  Filled 2021-11-17: qty 5

## 2021-11-17 MED ORDER — SUGAMMADEX SODIUM 200 MG/2ML IV SOLN
INTRAVENOUS | Status: DC | PRN
Start: 2021-11-17 — End: 2021-11-17
  Administered 2021-11-17: 200 mg via INTRAVENOUS

## 2021-11-17 MED ORDER — CEFAZOLIN SODIUM-DEXTROSE 2-4 GM/100ML-% IV SOLN
INTRAVENOUS | Status: AC
  Filled 2021-11-17: qty 100

## 2021-11-17 MED ORDER — CHLORHEXIDINE GLUCONATE CLOTH 2 % EX PADS: 6.0000 | MEDICATED_PAD | Freq: Once | CUTANEOUS | Status: DC

## 2021-11-17 MED ORDER — OXYCODONE-ACETAMINOPHEN 5-325 MG PO TABS
1.0000 | ORAL_TABLET | ORAL | Status: DC | PRN
  Administered 2021-11-17 – 2021-11-18 (×4): 2 via ORAL
  Filled 2021-11-17 (×4): qty 2

## 2021-11-17 MED ORDER — MIDAZOLAM HCL 2 MG/2ML IJ SOLN
INTRAMUSCULAR | Status: DC | PRN
Start: 2021-11-17 — End: 2021-11-17
  Administered 2021-11-17 (×2): 1 mg via INTRAVENOUS

## 2021-11-17 MED ORDER — ROCURONIUM BROMIDE 10 MG/ML (PF) SYRINGE
PREFILLED_SYRINGE | INTRAVENOUS | Status: DC | PRN
  Administered 2021-11-17: 100 mg via INTRAVENOUS
  Administered 2021-11-17 (×2): 30 mg via INTRAVENOUS
  Administered 2021-11-17: 20 mg via INTRAVENOUS

## 2021-11-17 MED ORDER — MENTHOL 3 MG MT LOZG: 1.0000 | LOZENGE | OROMUCOSAL | Status: DC | PRN

## 2021-11-17 MED ORDER — THROMBIN 20000 UNITS EX SOLR
CUTANEOUS | Status: DC | PRN
  Administered 2021-11-17: 20 mL via TOPICAL

## 2021-11-17 MED ORDER — BUPIVACAINE-EPINEPHRINE 0.5% -1:200000 IJ SOLN
INTRAMUSCULAR | Status: AC
  Filled 2021-11-17: qty 1

## 2021-11-17 MED ORDER — PROPOFOL 10 MG/ML IV BOLUS
INTRAVENOUS | Status: DC | PRN
  Administered 2021-11-17: 200 mg via INTRAVENOUS

## 2021-11-17 MED ORDER — CHLORHEXIDINE GLUCONATE 0.12 % MT SOLN
OROMUCOSAL | Status: AC
  Administered 2021-11-17: 15 mL via OROMUCOSAL
  Filled 2021-11-17: qty 15

## 2021-11-17 MED ORDER — KETOROLAC TROMETHAMINE 15 MG/ML IJ SOLN
7.5000 mg | Freq: Four times a day (QID) | INTRAMUSCULAR | Status: DC
  Administered 2021-11-17 – 2021-11-18 (×3): 7.5 mg via INTRAVENOUS
  Filled 2021-11-17 (×3): qty 1

## 2021-11-17 MED ORDER — FENTANYL CITRATE (PF) 250 MCG/5ML IJ SOLN
INTRAMUSCULAR | Status: DC | PRN
  Administered 2021-11-17: 150 ug via INTRAVENOUS
  Administered 2021-11-17 (×2): 100 ug via INTRAVENOUS

## 2021-11-17 SURGICAL SUPPLY — 67 items
BAG COUNTER SPONGE SURGICOUNT (BAG) ×3 IMPLANT
BASKET BONE COLLECTION (BASKET) ×2 IMPLANT
BLADE CLIPPER SURG (BLADE) ×1 IMPLANT
BONE CANC CHIPS 20CC PCAN1/4 (Bone Implant) ×2 IMPLANT
BUR MATCHSTICK NEURO 3.0 LAGG (BURR) ×2 IMPLANT
CAGE COROENT LG 12X9X23-12 (Cage) ×2 IMPLANT
CANISTER SUCT 3000ML PPV (MISCELLANEOUS) ×2 IMPLANT
CHIPS CANC BONE 20CC PCAN1/4 (Bone Implant) ×1 IMPLANT
CNTNR URN SCR LID CUP LEK RST (MISCELLANEOUS) ×1 IMPLANT
CONT SPEC 4OZ STRL OR WHT (MISCELLANEOUS) ×1
COVER BACK TABLE 60X90IN (DRAPES) ×2 IMPLANT
DERMABOND ADHESIVE PROPEN (GAUZE/BANDAGES/DRESSINGS) ×1
DERMABOND ADVANCED (GAUZE/BANDAGES/DRESSINGS) ×1
DERMABOND ADVANCED .7 DNX12 (GAUZE/BANDAGES/DRESSINGS) ×1 IMPLANT
DERMABOND ADVANCED .7 DNX6 (GAUZE/BANDAGES/DRESSINGS) IMPLANT
DEVICE DISSECT PLASMABLAD 3.0S (MISCELLANEOUS) ×1 IMPLANT
DRAPE C-ARM 42X72 X-RAY (DRAPES) ×3 IMPLANT
DRAPE HALF SHEET 40X57 (DRAPES) ×1 IMPLANT
DRAPE LAPAROTOMY 100X72X124 (DRAPES) ×2 IMPLANT
DRSG OPSITE POSTOP 4X8 (GAUZE/BANDAGES/DRESSINGS) ×1 IMPLANT
DURAPREP 26ML APPLICATOR (WOUND CARE) ×2 IMPLANT
DURASEAL APPLICATOR TIP (TIP) IMPLANT
DURASEAL SPINE SEALANT 3ML (MISCELLANEOUS) IMPLANT
ELECT REM PT RETURN 9FT ADLT (ELECTROSURGICAL) ×2
ELECTRODE REM PT RTRN 9FT ADLT (ELECTROSURGICAL) ×1 IMPLANT
GAUZE 4X4 16PLY ~~LOC~~+RFID DBL (SPONGE) ×1 IMPLANT
GAUZE SPONGE 4X4 12PLY STRL (GAUZE/BANDAGES/DRESSINGS) ×2 IMPLANT
GLOVE SURG LTX SZ7 (GLOVE) ×2 IMPLANT
GLOVE SURG LTX SZ8.5 (GLOVE) ×4 IMPLANT
GLOVE SURG POLYISO LF SZ6.5 (GLOVE) ×1 IMPLANT
GLOVE SURG UNDER POLY LF SZ6.5 (GLOVE) ×2 IMPLANT
GLOVE SURG UNDER POLY LF SZ7.5 (GLOVE) ×2 IMPLANT
GLOVE SURG UNDER POLY LF SZ8.5 (GLOVE) ×4 IMPLANT
GOWN STRL REUS W/ TWL LRG LVL3 (GOWN DISPOSABLE) IMPLANT
GOWN STRL REUS W/ TWL XL LVL3 (GOWN DISPOSABLE) IMPLANT
GOWN STRL REUS W/TWL 2XL LVL3 (GOWN DISPOSABLE) ×4 IMPLANT
GOWN STRL REUS W/TWL LRG LVL3 (GOWN DISPOSABLE) ×1
GOWN STRL REUS W/TWL XL LVL3 (GOWN DISPOSABLE) ×3
GRAFT BNE CANC CHIPS 1-8 20CC (Bone Implant) IMPLANT
GRAFT BONE PROTEIOS LRG 5CC (Orthopedic Implant) ×1 IMPLANT
HEMOSTAT POWDER KIT SURGIFOAM (HEMOSTASIS) ×1 IMPLANT
KIT BASIN OR (CUSTOM PROCEDURE TRAY) ×2 IMPLANT
KIT GRAFTMAG DEL NEURO DISP (NEUROSURGERY SUPPLIES) ×1 IMPLANT
KIT TURNOVER KIT B (KITS) ×2 IMPLANT
MILL MEDIUM DISP (BLADE) ×2 IMPLANT
NEEDLE HYPO 22GX1.5 SAFETY (NEEDLE) ×2 IMPLANT
NS IRRIG 1000ML POUR BTL (IV SOLUTION) ×2 IMPLANT
PACK LAMINECTOMY NEURO (CUSTOM PROCEDURE TRAY) ×2 IMPLANT
PAD ARMBOARD 7.5X6 YLW CONV (MISCELLANEOUS) ×5 IMPLANT
PATTIES SURGICAL .5 X1 (DISPOSABLE) ×2 IMPLANT
PLASMABLADE 3.0S (MISCELLANEOUS) ×2
ROD RELIN-O LORD 5.5X65MM (Rod) ×2 IMPLANT
SCREW LOCK RELINE 5.5 TULIP (Screw) ×6 IMPLANT
SCREW RELINE-O POLY 6.5X45 (Screw) ×6 IMPLANT
SPONGE SURGIFOAM ABS GEL 100 (HEMOSTASIS) ×2 IMPLANT
SPONGE T-LAP 4X18 ~~LOC~~+RFID (SPONGE) ×2 IMPLANT
SUT PROLENE 6 0 BV (SUTURE) IMPLANT
SUT VIC AB 1 CT1 18XBRD ANBCTR (SUTURE) ×1 IMPLANT
SUT VIC AB 1 CT1 8-18 (SUTURE) ×1
SUT VIC AB 2-0 CP2 18 (SUTURE) ×3 IMPLANT
SUT VIC AB 3-0 SH 8-18 (SUTURE) ×2 IMPLANT
SUT VIC AB 4-0 RB1 18 (SUTURE) ×2 IMPLANT
SYR 3ML LL SCALE MARK (SYRINGE) ×8 IMPLANT
TOWEL GREEN STERILE (TOWEL DISPOSABLE) ×2 IMPLANT
TOWEL GREEN STERILE FF (TOWEL DISPOSABLE) ×2 IMPLANT
TRAY FOLEY MTR SLVR 16FR STAT (SET/KITS/TRAYS/PACK) ×2 IMPLANT
WATER STERILE IRR 1000ML POUR (IV SOLUTION) ×2 IMPLANT

## 2021-11-17 NOTE — Anesthesia Procedure Notes (Signed)
Procedure Name: Intubation Date/Time: 11/17/2021 12:00 PM Performed by: Theresa Mulligan, RN Pre-anesthesia Checklist: Patient identified, Emergency Drugs available, Suction available and Patient being monitored Patient Re-evaluated:Patient Re-evaluated prior to induction Oxygen Delivery Method: Circle System Utilized Preoxygenation: Pre-oxygenation with 100% oxygen Induction Type: IV induction Ventilation: Mask ventilation without difficulty Laryngoscope Size: Mac and 4 Grade View: Grade I Tube type: Oral Number of attempts: 1 Airway Equipment and Method: Stylet Placement Confirmation: ETT inserted through vocal cords under direct vision, positive ETCO2 and breath sounds checked- equal and bilateral Secured at: 23 cm Tube secured with: Tape Dental Injury: Teeth and Oropharynx as per pre-operative assessment

## 2021-11-17 NOTE — H&P (Signed)
Mark Crawford is an 66 y.o. male.   Chief Complaint: Back pain leg pain buttock pain history of fusion L4-5 HPI: Mr. Mark Crawford is a 66 year old individual whose had significant back pain that has worsened radiating into both buttocks.  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  He was slow to heal and developed some subsidence of the graft and this resulted in recurrence of the stenosis that he had at L4-L5.  In addition he has had some stenosis evolve at the L3-L4 level with a grade 1 spondylolisthesis.  After careful consideration of his options I advised a posterior decompression fusion at L3-4 and supplemental fixation for the L4-5 arthrodesis with a laminectomy at L4-5.  He is now admitted for that procedure.  Past Medical History:  Diagnosis Date   Arthritis    Flu    11-2017   GERD (gastroesophageal reflux disease)    History of kidney stones    History of pulmonary embolus (PE)    bil lungs 2-19   PONV (postoperative nausea and vomiting)    Pulmonary embolus (HCC) 11/2017   bilateral    Past Surgical History:  Procedure Laterality Date   ABDOMINAL EXPOSURE N/A 10/25/2019   Procedure: Abdominal Exposure;  Surgeon: Larina Earthly, MD;  Location: [facility] OR;  Service: Cardiovascular;  Laterality: N/A;  left side approach   ANTERIOR LUMBAR FUSION N/A 10/25/2019   Procedure: Lumbar Four-Five  Anterior lumbar interbody fusion;  Surgeon: Barnett Abu, MD;  Location: [facility] OR;  Service: Neurosurgery;  Laterality: N/A;  anterior   BACK SURGERY     EYE SURGERY     bil cataract   FRACTURE SURGERY     rod in right arm   JOINT REPLACEMENT     right total hip Dr. Turner Daniels 07-10-18   kidney flone surgery     KNEE ARTHROSCOPY Left 07/10/2018   Procedure: ARTHROSCOPY KNEE with medial  meniscal repair;  Surgeon: Gean Birchwood, MD;  Location: [facility] ORS;  Service: Orthopedics;  Laterality: Left;   NECK SURGERY     2008   TOTAL HIP ARTHROPLASTY Right 07/10/2018   Procedure: RIGHT TOTAL HIP ARTHROPLASTY ANTERIOR APPROACH, AND ORIF OF CALCAR FRACTURE;  Surgeon: Gean Birchwood, MD;  Location: [facility] ORS;  Service: Orthopedics;  Laterality: Right;   VASECTOMY      No family history on file. Social History:  reports that he has never smoked. He has never used smokeless tobacco. He reports that he does not drink alcohol and does not use drugs.  Allergies: No Known Allergies  Medications Prior to Admission  Medication Sig Dispense Refill   Ascorbic Acid (VITAMIN C PO) Take 1 tablet by mouth daily.     cetirizine (ZYRTEC) 10 MG tablet Take 10 mg by mouth every evening.     Cholecalciferol (VITAMIN D3) 125 MCG (5000 UT) CAPS Take 5,000 Units by mouth every Sunday.     famotidine (PEPCID) 20 MG tablet Take 20 mg by mouth every evening.     meloxicam (MOBIC) 15 MG tablet Take 15 mg by mouth daily.     Multiple Vitamin (MULTIVITAMIN WITH MINERALS) TABS tablet Take 1 tablet by mouth daily.     Nutritional Supplements (DHEA PO) Take 15 mg by mouth daily.     omeprazole (PRILOSEC) 40 MG capsule Take 40  mg by mouth daily.     oxyCODONE-acetaminophen (PERCOCET/ROXICET) 5-325 MG tablet Take 1 tablet by mouth every 6 (six) hours as needed for severe pain.     pregabalin (LYRICA) 100 MG capsule Take 100 mg by mouth every evening.     rosuvastatin (CRESTOR) 20 MG tablet Take 20 mg by mouth daily.      senna-docusate (SENOKOT-S) 8.6-50 MG tablet Take 1 tablet by mouth every evening.     traZODone (DESYREL) 100 MG tablet Take 100 mg by mouth at bedtime.      No results found for this or any previous visit (from the past 48 hour(s)). No results found.  Review of Systems  Constitutional:  Positive for activity change.  Musculoskeletal:  Positive for back pain, gait problem and myalgias.   Neurological:  Positive for weakness and numbness.  All other systems reviewed and are negative.  Blood pressure (!) 168/82, pulse 74, temperature 98.1 F (36.7 C), temperature source Oral, resp. rate 17, height 6\' 1"  (1.854 m), weight 111.1 kg, SpO2 97 %. Physical Exam Constitutional:      Appearance: Normal appearance. He is normal weight.  HENT:     Head: Normocephalic and atraumatic.     Right Ear: Tympanic membrane normal.     Left Ear: Tympanic membrane normal.     Nose: Nose normal.     Mouth/Throat:     Mouth: Mucous membranes are moist.  Eyes:     Extraocular Movements: Extraocular movements intact.     Conjunctiva/sclera: Conjunctivae normal.     Pupils: Pupils are equal, round, and reactive to light.  Cardiovascular:     Rate and Rhythm: Normal rate and regular rhythm.  Pulmonary:     Effort: Pulmonary effort is normal.  Abdominal:     General: Abdomen is flat. Bowel sounds are normal.     Palpations: Abdomen is soft.  Musculoskeletal:     Cervical back: Normal range of motion.     Comments: Positive straight leg raising at 45 degrees Patrick's maneuver is negative bilaterally  Skin:    General: Skin is warm and dry.     Capillary Refill: Capillary refill takes less than 2 seconds.  Neurological:     General: No focal deficit present.     Mental Status: He is alert.  Psychiatric:        Mood and Affect: Mood normal.        Behavior: Behavior normal.        Thought Content: Thought content normal.        Judgment: Judgment normal.     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  Posterior decompression via laminectomy L3-4 L4-5 with posterior interbody arthrodesis L3-4 and supplemental fixation with posterolateral arthrodesis L3-4 L4-5.  , MD 11/17/2021, 10:59 AM

## 2021-11-17 NOTE — Anesthesia Preprocedure Evaluation (Addendum)
Anesthesia Evaluation  Patient identified by MRN, date of birth, ID band Patient awake    Reviewed: Allergy & Precautions, NPO status , Patient's Chart, lab work & pertinent test results  History of Anesthesia Complications (+) PONV and history of anesthetic complications  Airway Mallampati: II  TM Distance: >3 FB Neck ROM: Full    Dental no notable dental hx. (+) Dental Advisory Given   Pulmonary PE PE resolved    Pulmonary exam normal        Cardiovascular negative cardio ROS Normal cardiovascular exam     Neuro/Psych negative neurological ROS     GI/Hepatic Neg liver ROS, GERD  Medicated,  Endo/Other  negative endocrine ROS  Renal/GU negative Renal ROS     Musculoskeletal  (+) Arthritis ,   Abdominal   Peds  Hematology negative hematology ROS (+)   Anesthesia Other Findings   Reproductive/Obstetrics                            Anesthesia Physical Anesthesia Plan  ASA: 2  Anesthesia Plan: General   Post-op Pain Management: Tylenol PO (pre-op)* and Ketamine IV*   Induction: Intravenous  PONV Risk Score and Plan: 4 or greater and Ondansetron, Aprepitant, Midazolam and Scopolamine patch - Pre-op  Airway Management Planned: Oral ETT  Additional Equipment:   Intra-op Plan:   Post-operative Plan: Extubation in OR  Informed Consent: I have reviewed the patients History and Physical, chart, labs and discussed the procedure including the risks, benefits and alternatives for the proposed anesthesia with the patient or authorized representative who has indicated his/her understanding and acceptance.     Dental advisory given  Plan Discussed with: Anesthesiologist and CRNA  Anesthesia Plan Comments:        Anesthesia Quick Evaluation

## 2021-11-17 NOTE — Transfer of Care (Signed)
Immediate Anesthesia Transfer of Care Note  Patient: Mark Crawford  Procedure(s) Performed: Lumbar three-four Posterior lumbar interbody fusion laminectomy with posterior stabilization from Lumbar three to Lumbar five (Back)  Patient Location: PACU  Anesthesia Type:General  Level of Consciousness: awake, alert  and oriented  Airway & Oxygen Therapy: Patient Spontanous Breathing and Patient connected to face mask oxygen  Post-op Assessment: Report given to RN and Post -op Vital signs reviewed and stable  Post vital signs: Reviewed and stable  Last Vitals:  Vitals Value Taken Time  BP 133/74 11/17/21 1628  Temp    Pulse 78 11/17/21 1631  Resp 16 11/17/21 1631  SpO2 94 % 11/17/21 1631  Vitals shown include unvalidated device data.  Last Pain:  Vitals:   11/17/21 1137  TempSrc:   PainSc: 7          Complications: No notable events documented.

## 2021-11-17 NOTE — Progress Notes (Signed)
Orthopedic Tech Progress Note Patient Details:  Mark Crawford October 20, 1955 [id number]  Ortho Devices Type of Ortho Device: Lumbar corsett Ortho Device/Splint Location: BACK Ortho Device/Splint Interventions: Ordered   Post Interventions Patient Tolerated: Well Instructions Provided: Care of device  Donald Pore 11/17/2021, 5:58 PM

## 2021-11-17 NOTE — Op Note (Signed)
Date of surgery: 11/17/2021 Preoperative diagnosis: Lumbar spinal stenosis L3-L4 spondylolisthesis L4-L5 status post anterior decompression arthrodesis L4-L5.  Neurogenic claudication, lumbar radiculopathy. Postoperative diagnosis: Same Procedure: Laminectomy for decompression L3-4 and L4-5 with more work than required for simple interbody technique.  Posterior lumbar interbody arthrodesis L3-4 with peek spacers "autograft allograft and Proteus.  Posterolateral arthrodesis L3-4 and L4-5 with local autograft allograft and Proteus.  Pedicle screw fixation L3-L5 with fluoroscopic image guidance. Surgeon: Kristeen Miss First Assistant: Consuella Lose MD Indications: Mark Crawford is a 66 year old individual who has had a decompression at L4-5 via an ALIF.  He did well for a while but he was slow to heal his ALIF and had some subsidence then it was noted that he developed a significant stenosis at L4-5 but also at L3-L4 with a slight anterolisthesis at the L3-4 level on a myelogram performed in October 2022.  He was advised regarding the need for surgery to decompress and stabilize from L3-L5.  He is now admitted for that procedure.  Procedure: The patient was brought to the operating room supine on the stretcher.  After the smooth induction of general endotracheal anesthesia and placement of a Foley catheter the patient was carefully turned prone onto the operating table.  The back was prepped with alcohol DuraPrep and draped in a sterile fashion.  Midline incision was created and carried down to the lumbodorsal fascia which was opened at what was identified ultimately as L3-4.  The dissection was then carried down to L4-5 and a subperiosteal dissection was carried out over the facet joints at L4-5 and L3-4.  Self-retaining retractors were placed in the wound after carefully dissecting off the soft tissues a laminectomy was created removing the inferior margin lamina of L3 out to and including the entirety of  the facet L4 laminar arch was completely removed including the facets at L4-L5.  This allowed for good decompression of the common dural tube.  Then the L3-4 disc space was isolated as the L3 nerve root was decompressed superiorly and the L4 nerve root was decompressed inferiorly.  A discectomy was then performed removing the entirety of the disc space at L3-4.  The interspace was sized for appropriate size spacers and it was felt that a 12 x 9 x 23 mm spacer with 12 degrees lordosis would fit best into this interval.  To the spacers were fit and filled with bone in addition to placing a total of 16 cc of bone graft into the interspace at the L3-4 level.  Once the bone was packed with the spacers attention was turned to the intertransverse spaces which were decorticated and then a total of 9 cc of bone graft was packed into each lateral gutter.  Pedicle entry sites were then chosen at L3-L4 and L5 and 6.5 x 45 mm screws were placed into the pedicles at L3-L4 and L5.  These were checked with fluoroscopic guidance.  65 mm precontoured 5.5 mm diameter rods were then placed between the screw heads and tightened in a neutral construct.  Care was taken to make sure the individual nerve roots and the common dural tube was well decompressed hemostasis in the soft tissues was obtained meticulously and lateral gutters were well packed with the bone.  Then the lumbodorsal fascia was closed with #1 Vicryl in interrupted fashion 2-0 Vicryl was used in the subcutaneous tissues 3-0 Vicryl subcuticularly along with 4-0 Vicryl in the subcuticular layer blood loss was estimated at 400 cc and 100 cc of Cell  Saver blood was returned to the patient.  Patient was returned to recovery room in stable.

## 2021-11-17 NOTE — Anesthesia Postprocedure Evaluation (Signed)
Anesthesia Post Note  Patient: Mark Crawford  Procedure(s) Performed: Lumbar three-four Posterior lumbar interbody fusion laminectomy with posterior stabilization from Lumbar three to Lumbar five (Back)     Patient location during evaluation: PACU Anesthesia Type: General Level of consciousness: awake and alert and oriented Pain management: pain level controlled Vital Signs Assessment: post-procedure vital signs reviewed and stable Respiratory status: spontaneous breathing, nonlabored ventilation and respiratory function stable Cardiovascular status: blood pressure returned to baseline and stable Postop Assessment: no apparent nausea or vomiting Anesthetic complications: no   No notable events documented.  Last Vitals:  Vitals:   11/17/21 1630 11/17/21 1645  BP: 133/74 110/67  Pulse: 79 70  Resp: 13 18  Temp:    SpO2: 98% 94%    Last Pain:  Vitals:   11/17/21 1645  TempSrc:   PainSc: 7                  Channing Yeager A.

## 2021-11-18 DIAGNOSIS — M48062 Spinal stenosis, lumbar region with neurogenic claudication: Secondary | ICD-10-CM | POA: Diagnosis not present

## 2021-11-18 LAB — CBC
HCT: 34.9 % — ABNORMAL LOW (ref 39.0–52.0)
Hemoglobin: 11.8 g/dL — ABNORMAL LOW (ref 13.0–17.0)
MCH: 30.5 pg (ref 26.0–34.0)
MCHC: 33.8 g/dL (ref 30.0–36.0)
MCV: 90.2 fL (ref 80.0–100.0)
Platelets: 155 10*3/uL (ref 150–400)
RBC: 3.87 MIL/uL — ABNORMAL LOW (ref 4.22–5.81)
RDW: 12.5 % (ref 11.5–15.5)
WBC: 9.8 10*3/uL (ref 4.0–10.5)
nRBC: 0 % (ref 0.0–0.2)

## 2021-11-18 LAB — BASIC METABOLIC PANEL
Anion gap: 6 (ref 5–15)
BUN: 20 mg/dL (ref 8–23)
CO2: 26 mmol/L (ref 22–32)
Calcium: 8.4 mg/dL — ABNORMAL LOW (ref 8.9–10.3)
Chloride: 105 mmol/L (ref 98–111)
Creatinine, Ser: 1.27 mg/dL — ABNORMAL HIGH (ref 0.61–1.24)
GFR, Estimated: >60 mL/min (ref >60)
Glucose, Bld: 114 mg/dL — ABNORMAL HIGH (ref 70–99)
Potassium: 4.5 mmol/L (ref 3.5–5.1)
Sodium: 137 mmol/L (ref 135–145)

## 2021-11-18 MED ORDER — METHOCARBAMOL 500 MG PO TABS: 500.0000 mg | ORAL_TABLET | Freq: Four times a day (QID) | ORAL | 2 refills | Status: AC | PRN

## 2021-11-18 MED ORDER — TRAMADOL HCL 50 MG PO TABS: 50.0000 mg | ORAL_TABLET | Freq: Four times a day (QID) | ORAL | 0 refills | Status: AC | PRN

## 2021-11-18 MED FILL — Heparin Sodium (Porcine) Inj 1000 Unit/ML: INTRAMUSCULAR | Qty: 30 | Status: AC

## 2021-11-18 MED FILL — Sodium Chloride IV Soln 0.9%: INTRAVENOUS | Qty: 1000 | Status: AC

## 2021-11-18 NOTE — Care Management Obs Status (Signed)
MEDICARE OBSERVATION STATUS NOTIFICATION   Patient Details  Name: Mark Crawford MRN: [id number] Date of Birth: [birth year]   Medicare Observation Status Notification Given:  Yes    Lawerance Sabal, RN 11/18/2021, 9:54 AM

## 2021-11-18 NOTE — Progress Notes (Signed)
Patient alert and oriented, mae's well, voiding adequate amount of urine, swallowing without difficulty, no c/o pain at time of discharge. Patient discharged home with family. Script and discharged instructions given to patient. Patient and family stated understanding of instructions given. Patient has an appointment with Dr. Elsner  

## 2021-11-18 NOTE — Care Management CC44 (Signed)
Condition Code 44 Documentation Completed  Patient Details  Name: Mark Crawford MRN: [id number] Date of Birth: 03/16/1956   Condition Code 44 given:  Yes Patient signature on Condition Code 44 notice:  Yes Documentation of 2 MD's agreement:  Yes Code 44 added to claim:  Yes    Lawerance Sabal, RN 11/18/2021, 9:54 AM

## 2021-11-18 NOTE — Evaluation (Signed)
Occupational Therapy Evaluation Patient Details Name: Mark Crawford MRN: [id number] DOB: 05-Jun-1956 Today's Date: 11/18/2021   History of Present Illness 66 y/o male admitted on 11/17/21 following posterior decompression of L3-5 and posterior lumbar fusion L3-5. PMH: hx of PE, hx of anterior lumbar fusion   Clinical Impression   Pt admitted for procedure listed above. PTA pt reported that he was independent with all ADL's and IADL's, using no DME. At this time, pt mildly limited by pain, however able to complete BADL's with no assist, following compensatory strategies. Pt's wife also reports that she will be able to assist him with whatever he needs at home. Reviewed all spinal precautions and compensatory strategies. Pt has no further OT needs and acute OT will sign off.      Recommendations for follow up therapy are one component of a multi-disciplinary discharge planning process, led by the attending physician.  Recommendations may be updated based on patient status, additional functional criteria and insurance authorization.   Follow Up Recommendations  No OT follow up    Assistance Recommended at Discharge PRN  Patient can return home with the following A little help with bathing/dressing/bathroom    Functional Status Assessment  Patient has had a recent decline in their functional status and demonstrates the ability to make significant improvements in function in a reasonable and predictable amount of time.  Equipment Recommendations  None recommended by OT    Recommendations for Other Services       Precautions / Restrictions Precautions Precautions: Back Precaution Booklet Issued: Yes (comment) Required Braces or Orthoses: Spinal Brace Spinal Brace: Lumbar corset;Applied in standing position Restrictions Weight Bearing Restrictions: No      Mobility Bed Mobility               General bed mobility comments: sitting in chair on arrival    Transfers Overall  transfer level: Modified independent Equipment used: None               General transfer comment: increased time to come into standing using arms of chair to push up      Balance Overall balance assessment: No apparent balance deficits (not formally assessed)                                         ADL either performed or assessed with clinical judgement   ADL Overall ADL's : Modified independent                                       General ADL Comments: Pt able to complete all ADL's with no assist, using compensatory techniques     Vision Baseline Vision/History: 0 No visual deficits Ability to See in Adequate Light: 0 Adequate Patient Visual Report: No change from baseline Vision Assessment?: No apparent visual deficits     Perception     Praxis      Pertinent Vitals/Pain Pain Assessment Pain Assessment: 0-10 Pain Score: 5  Pain Location: back Pain Descriptors / Indicators: Grimacing Pain Intervention(s): Monitored during session, Repositioned     Hand Dominance     Extremity/Trunk Assessment Upper Extremity Assessment Upper Extremity Assessment: Overall WFL for tasks assessed   Lower Extremity Assessment Lower Extremity Assessment: Overall WFL for tasks assessed   Cervical / Trunk Assessment Cervical / Trunk  Assessment: Back Surgery   Communication Communication Communication: No difficulties   Cognition Arousal/Alertness: Awake/alert Behavior During Therapy: WFL for tasks assessed/performed Overall Cognitive Status: Within Functional Limits for tasks assessed                                       General Comments  VSS on RA, honecomb dressing intact with some visable drainage. RN aware    Exercises     Shoulder Instructions      Home Living Family/patient expects to be discharged to:: Private residence Living Arrangements: Spouse/significant other Available Help at Discharge:  Family;Available 24 hours/day Type of Home: House Home Access: Stairs to enter [employer] of Steps: 2 Entrance Stairs-Rails: None Home Layout: Two level;Able to live on main level with bedroom/bathroom     Bathroom Shower/Tub: Producer, television/film/video: Handicapped height     Home Equipment: None          Prior Functioning/Environment Prior Level of Function : Independent/Modified Independent                        OT Problem List: Decreased strength;Decreased activity tolerance;Impaired balance (sitting and/or standing);Pain      OT Treatment/Interventions:      OT Goals(Current goals can be found in the care plan section) Acute Rehab OT Goals Patient Stated Goal: To go home OT Goal Formulation: With patient Time For Goal Achievement: 11/18/21 Potential to Achieve Goals: Good  OT Frequency:      Co-evaluation              AM-PAC OT "6 Clicks" Daily Activity     Outcome Measure Help from another person eating meals?: None Help from another person taking care of personal grooming?: None Help from another person toileting, which includes using toliet, bedpan, or urinal?: None Help from another person bathing (including washing, rinsing, drying)?: None Help from another person to put on and taking off regular upper body clothing?: None Help from another person to put on and taking off regular lower body clothing?: None 6 Click Score: 24   End of Session Equipment Utilized During Treatment: Back brace Nurse Communication: Mobility status  Activity Tolerance: Patient tolerated treatment well Patient left: in bed;with call bell/phone within reach;with family/visitor present  OT Visit Diagnosis: Unsteadiness on feet (R26.81);Other abnormalities of gait and mobility (R26.89);Muscle weakness (generalized) (M62.81)                Time: 2542-7062 OT Time Calculation (min): 33 min Charges:  OT General Charges $OT Visit: 1 Visit OT  Evaluation $OT Eval Low Complexity: 1 Low OT Treatments $Self Care/Home Management : 8-22 mins  Emelia Sandoval H., OTR/L Acute Rehabilitation  Trannie Bardales Elane Nehan Flaum 11/18/2021, 10:32 AM

## 2021-11-18 NOTE — Evaluation (Signed)
Physical Therapy Evaluation & Discharge Patient Details Name: Mark Crawford MRN: [id number] DOB: April 20, 1956 Today's Date: 11/18/2021  History of Present Illness  66 y/o male admitted on 11/17/21 following posterior decompression of L3-5 and posterior lumbar fusion L3-5. PMH: hx of PE, hx of anterior lumbar fusion  Clinical Impression  Patient admitted following above procedure. Patient functioning at modI level with no AD for all mobility. Educated patient on back precautions, brace wear, and progressive walking program, patient and wife verbalized understanding. Patient will have wife available to assist at discharge. No further skilled PT needs identified acutely. No PT follow up recommended at this time.        Recommendations for follow up therapy are one component of a multi-disciplinary discharge planning process, led by the attending physician.  Recommendations may be updated based on patient status, additional functional criteria and insurance authorization.  Follow Up Recommendations No PT follow up    Assistance Recommended at Discharge Set up Supervision/Assistance  Patient can return home with the following       Equipment Recommendations None recommended by PT  Recommendations for Other Services       Functional Status Assessment Patient has not had a recent decline in their functional status     Precautions / Restrictions Precautions Precautions: Back Precaution Booklet Issued: Yes (comment) Required Braces or Orthoses: Spinal Brace Spinal Brace: Lumbar corset;Applied in standing position Restrictions Weight Bearing Restrictions: No      Mobility  Bed Mobility               General bed mobility comments: sitting in chair on arrival    Transfers Overall transfer level: Modified independent Equipment used: None               General transfer comment: increased time to come into standing using arms of chair to push up     Ambulation/Gait Ambulation/Gait assistance: Modified independent (Device/Increase time) Gait Distance (Feet): 400 Feet Assistive device: None Gait Pattern/deviations: WFL(Within Functional Limits) Gait velocity: decreased        Stairs Stairs: Yes Stairs assistance: Modified independent (Device/Increase time) Stair Management: No rails, Alternating pattern, Forwards Number of Stairs: 2    Wheelchair Mobility    Modified Rankin (Stroke Patients Only)       Balance Overall balance assessment: No apparent balance deficits (not formally assessed)                                           Pertinent Vitals/Pain Pain Assessment Pain Assessment: 0-10 Pain Score: 7  Pain Location: back Pain Descriptors / Indicators: Grimacing Pain Intervention(s): Monitored during session    Home Living Family/patient expects to be discharged to:: Private residence Living Arrangements: Spouse/significant other Available Help at Discharge: Family;Available 24 hours/day Type of Home: House Home Access: Stairs to enter Entrance Stairs-Rails: None Entrance Stairs-Number of Steps: 2   Home Layout: Two level;Able to live on main level with bedroom/bathroom Home Equipment: None      Prior Function Prior Level of Function : Independent/Modified Independent                     Hand Dominance        Extremity/Trunk Assessment   Upper Extremity Assessment Upper Extremity Assessment: Overall WFL for tasks assessed    Lower Extremity Assessment Lower Extremity Assessment: Overall WFL for tasks assessed  Cervical / Trunk Assessment Cervical / Trunk Assessment: Back Surgery  Communication   Communication: No difficulties  Cognition Arousal/Alertness: Awake/alert Behavior During Therapy: WFL for tasks assessed/performed Overall Cognitive Status: Within Functional Limits for tasks assessed                                           General Comments      Exercises     Assessment/Plan    PT Assessment Patient does not need any further PT services  PT Problem List         PT Treatment Interventions      PT Goals (Current goals can be found in the Care Plan section)  Acute Rehab PT Goals Patient Stated Goal: to go home PT Goal Formulation: All assessment and education complete, DC therapy    Frequency       Co-evaluation               AM-PAC PT "6 Clicks" Mobility  Outcome Measure Help needed turning from your back to your side while in a flat bed without using bedrails?: None Help needed moving from lying on your back to sitting on the side of a flat bed without using bedrails?: None Help needed moving to and from a bed to a chair (including a wheelchair)?: None Help needed standing up from a chair using your arms (e.g., wheelchair or bedside chair)?: None Help needed to walk in hospital room?: None Help needed climbing 3-5 steps with a railing? : None 6 Click Score: 24    End of Session Equipment Utilized During Treatment: Back brace Activity Tolerance: Patient tolerated treatment well Patient left: in chair;with family/visitor present Nurse Communication: Mobility status PT Visit Diagnosis: Muscle weakness (generalized) (M62.81)    Time: [id number] PT Time Calculation (min) (ACUTE ONLY): 17 min   Charges:   PT Evaluation $PT Eval Low Complexity: 1 Low          Demarkus Remmel A. Gilford Rile PT, DPT Acute Rehabilitation Services Pager [phone] Office [phone]   Linna Hoff 11/18/2021, 9:43 AM

## 2021-11-18 NOTE — Discharge Instructions (Signed)
Wound Care ?Leave incision open to air. ?You may shower. ?Do not scrub directly on incision.  ?Do not put any creams, lotions, or ointments on incision. ?Activity ?Walk each and every day, increasing distance each day. ?No lifting greater than 5 lbs.  Avoid excessive neck motion. ?No driving for 2 weeks; may ride as a passenger locally. ?Wear neck brace at all times except when showering.  If provided soft collar, may wear for comfort unless otherwise instructed. ?Diet ?Resume your normal diet.  ? ?Call Your Doctor If Any of These Occur ?Redness, drainage, or swelling at the wound.  ?Temperature greater than 101 degrees. ?Severe pain not relieved by pain medication. ?Increased difficulty swallowing. ?Incision starts to come apart. ?Follow Up Appt ?Call today for appointment in 3 weeks ([phone]) or for problems.  If you have any hardware placed in your spine, you will need an x-ray before your appointment.  ?

## 2021-11-18 NOTE — Discharge Summary (Signed)
Physician Discharge Summary  Patient ID: Mark Crawford MRN: [id number] DOB/AGE: June 12, 1956 66 y.o.  Admit date: 11/17/2021 Discharge date: 11/18/2021  Admission Diagnoses: Lumbar stenosis L3-4 and L4-5 with neurogenic claudication.  Lumbar radiculopathy.  Discharge Diagnoses: Lumbar stenosis L3-4 and L4-5 with neurogenic claudication.  Lumbar radiculopathy.  Acute blood loss anemia Principal Problem:   Degenerative lumbar spinal stenosis   Discharged Condition: good  Hospital Course: Patient was admitted to undergo surgical decompression at L3-4 and L4-5 with fusion at L2-3-4 and fixation posterolaterally L3-L5.  Patient tolerated surgery well.  Postoperatively was noted that the patient's preoperative hemoglobin of 15 had decreased to 11.  He was asymptomatic with his anemia and did not require transfusion.  Nonetheless this qualified for acute blood loss anemia.  Consults: None  Significant Diagnostic Studies: None  Treatments: surgery: See op note  Discharge Exam: Blood pressure 126/66, pulse 79, temperature 98.1 F (36.7 C), temperature source Oral, resp. rate 16, height 6\' 1"  (1.854 m), weight 111.1 kg, SpO2 99 %. Incision is clean and dry motor function is intact.  Disposition: Discharge disposition: 01-Home or Self Care       Discharge Instructions     Call MD for:  redness, tenderness, or signs of infection (pain, swelling, redness, odor or green/yellow discharge around incision site)   Complete by: As directed    Call MD for:  severe uncontrolled pain   Complete by: As directed    Call MD for:  temperature >100.4   Complete by: As directed    Diet - low sodium heart healthy   Complete by: As directed    Discharge wound care:   Complete by: As directed    Okay to shower. Do not apply salves or appointments to incision. No heavy lifting with the upper extremities greater than 10 pounds. May resume driving when not requiring pain medication and patient feels  comfortable with doing so.   Incentive spirometry RT   Complete by: As directed    Increase activity slowly   Complete by: As directed       Allergies as of 11/18/2021   No Known Allergies      Medication List     TAKE these medications    cetirizine 10 MG tablet Commonly known as: ZYRTEC Take 10 mg by mouth every evening.   DHEA PO Take 15 mg by mouth daily.   famotidine 20 MG tablet Commonly known as: PEPCID Take 20 mg by mouth every evening.   meloxicam 15 MG tablet Commonly known as: MOBIC Take 15 mg by mouth daily.   methocarbamol 500 MG tablet Commonly known as: ROBAXIN Take 1 tablet (500 mg total) by mouth every 6 (six) hours as needed for muscle spasms.   multivitamin with minerals Tabs tablet Take 1 tablet by mouth daily.   omeprazole 40 MG capsule Commonly known as: PRILOSEC Take 40 mg by mouth daily.   oxyCODONE-acetaminophen 5-325 MG tablet Commonly known as: PERCOCET/ROXICET Take 1 tablet by mouth every 6 (six) hours as needed for severe pain.   pregabalin 100 MG capsule Commonly known as: LYRICA Take 100 mg by mouth every evening.   rosuvastatin 20 MG tablet Commonly known as: CRESTOR Take 20 mg by mouth daily.   senna-docusate 8.6-50 MG tablet Commonly known as: Senokot-S Take 1 tablet by mouth every evening.   traMADol 50 MG tablet Commonly known as: Ultram Take 1 tablet (50 mg total) by mouth every 6 (six) hours as needed.   traZODone 100 MG  tablet Commonly known as: DESYREL Take 100 mg by mouth at bedtime.   VITAMIN C PO Take 1 tablet by mouth daily.   Vitamin D3 125 MCG (5000 UT) Caps Take 5,000 Units by mouth every Sunday.               Discharge Care Instructions  (From admission, onward)           Start     Ordered   11/18/21 0000  Discharge wound care:       Comments: Okay to shower. Do not apply salves or appointments to incision. No heavy lifting with the upper extremities greater than 10 pounds. May  resume driving when not requiring pain medication and patient feels comfortable with doing so.   11/18/21 0921             Signed: Blanchie Dessert Emagene Merfeld 11/18/2021, 9:22 AM

## 2021-12-26 IMAGING — CT CT L SPINE W/ CM
3 of 10 series · 11 of 33 positions shown, 13 images · non-contrast
Comparison: Chest radiographs 07/04/2018. Outside postoperative
lumbar MRI 03/04/2021. Lumbar radiographs 03/11/2021.

CLINICAL DATA: 64-year-old male with low back pain.  Prior surgery.

EXAM:
LUMBAR MYELOGRAM
FLUOROSCOPY TIME:  0 minutes 36 seconds
PROCEDURE:
Lumbar puncture and intrathecal contrast administration were
performed by Dr. NAOMY ABERA who will separately report for the
portion of the procedure. I personally supervised acquisition of the
myelogram images.
TECHNIQUE: Contiguous axial images were obtained through the Lumbar spine after
the intrathecal infusion of infusion. Coronal and sagittal
reconstructions were obtained of the axial image sets.

[Series 5: l spine 2.0 st · axial · 0.35mm/px · z∈[+1094,+1254]mm · 3 of 140 slices shown, 4 images]
[im 40/140  soft-tissue]
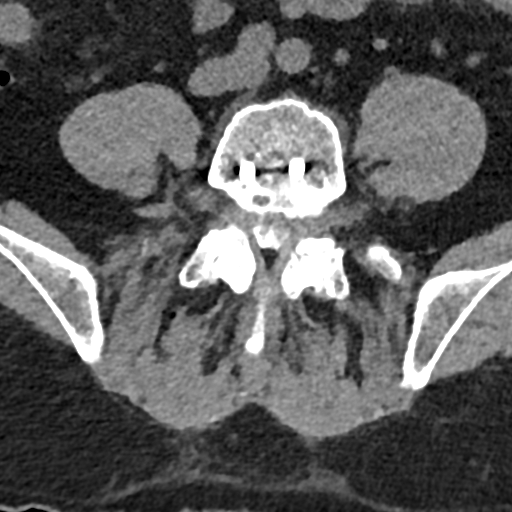
[im 40/140  bone]
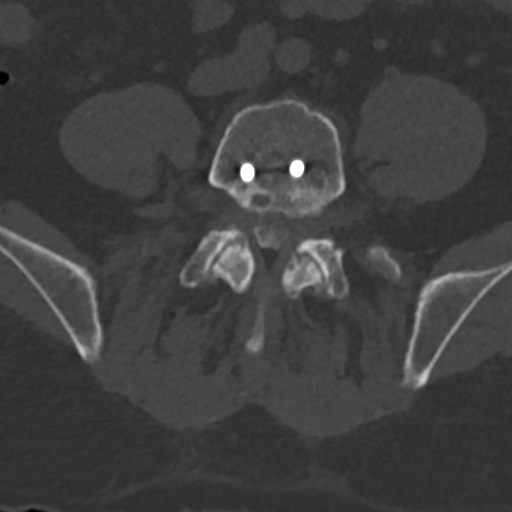
[im 80/140  bone]
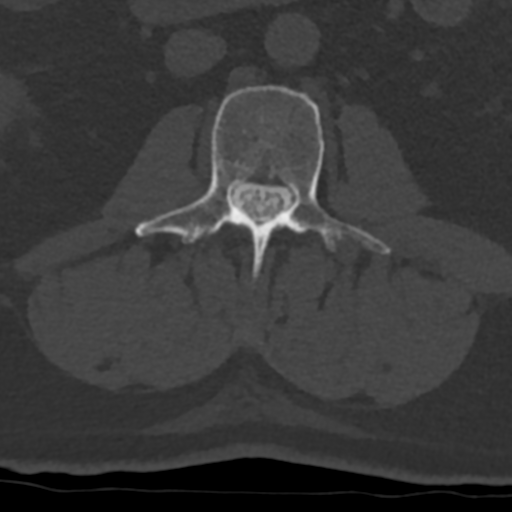
[im 120/140  bone]
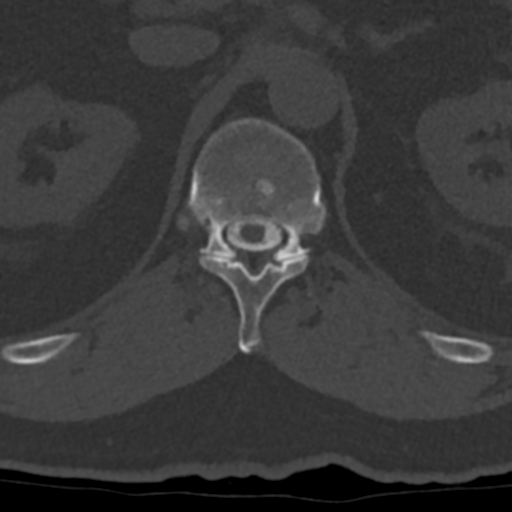

[Series 8: coronal st · coronal · 0.41mm/px · 3 of 68 slices shown]
[im 17/68  bone]
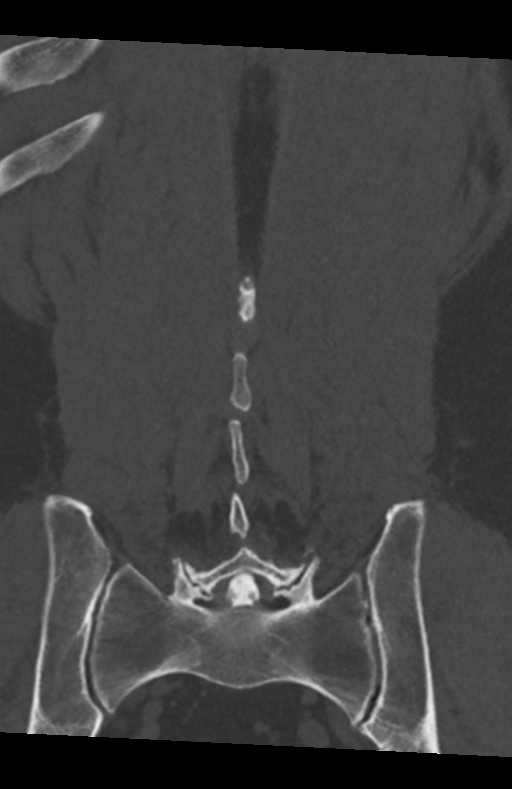
[im 34/68  bone]
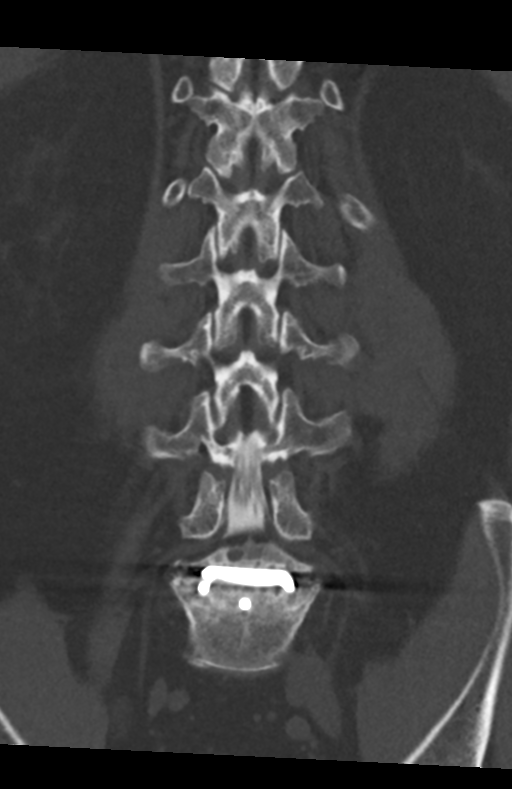
[im 51/68  bone]
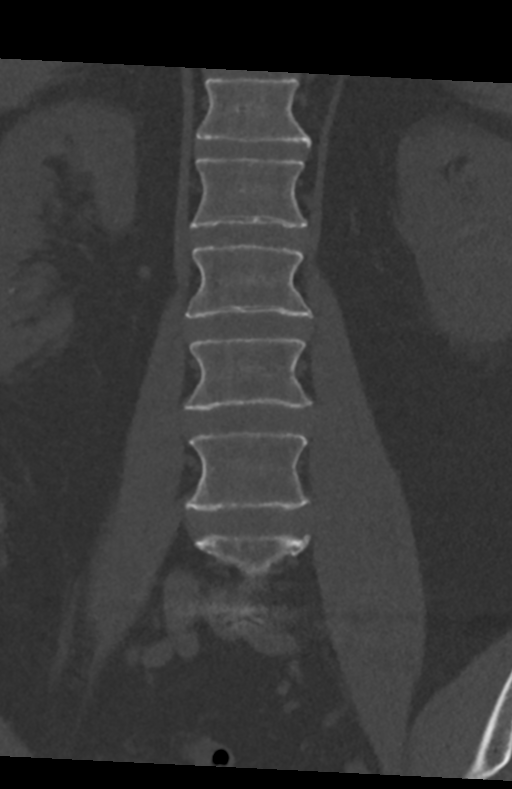

[Series 9: sagittal st · sagittal · 0.41mm/px · 5 of 61 slices shown, 6 images]
[im 21/61  bone]
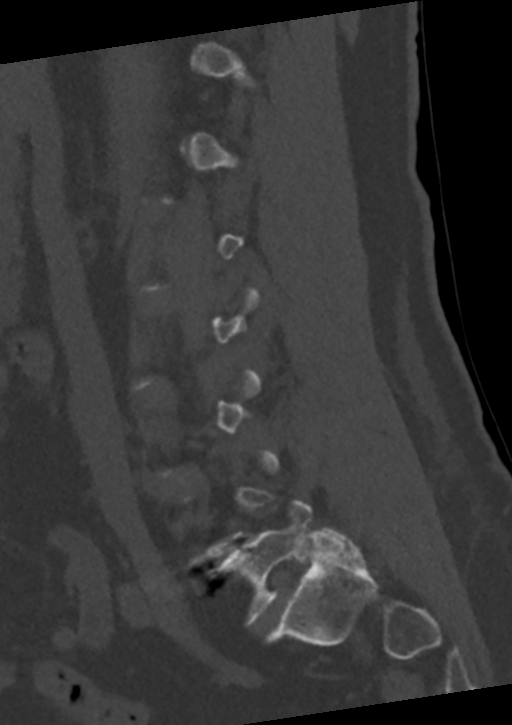
[im 26/61  bone]
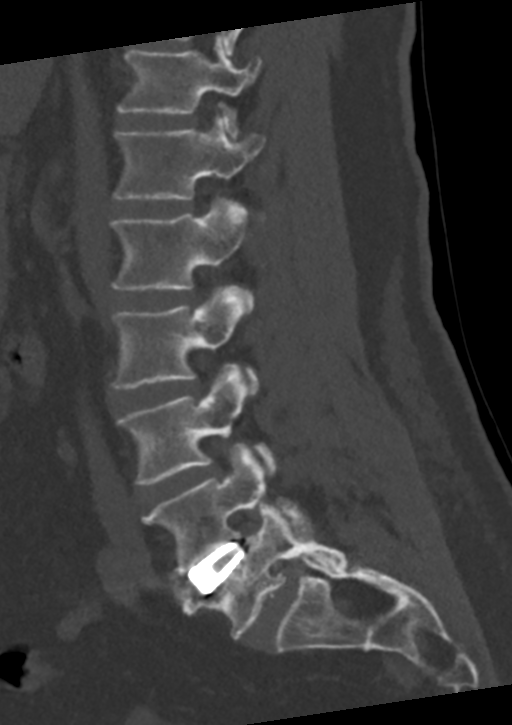
[im 31/61  soft-tissue]
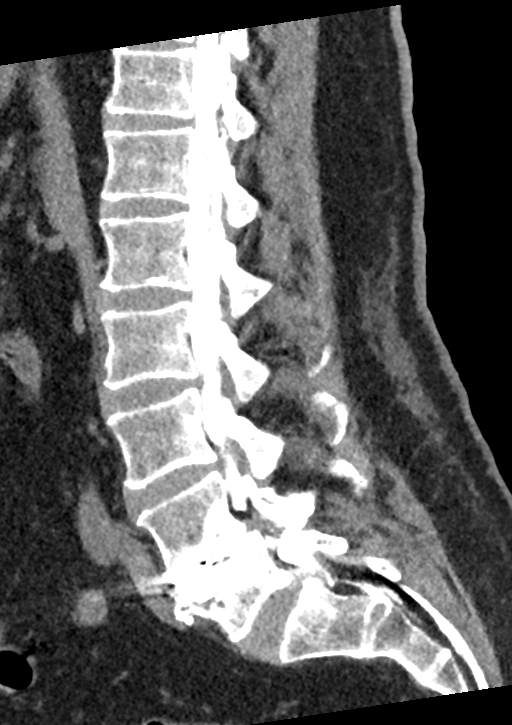
[im 31/61  bone]
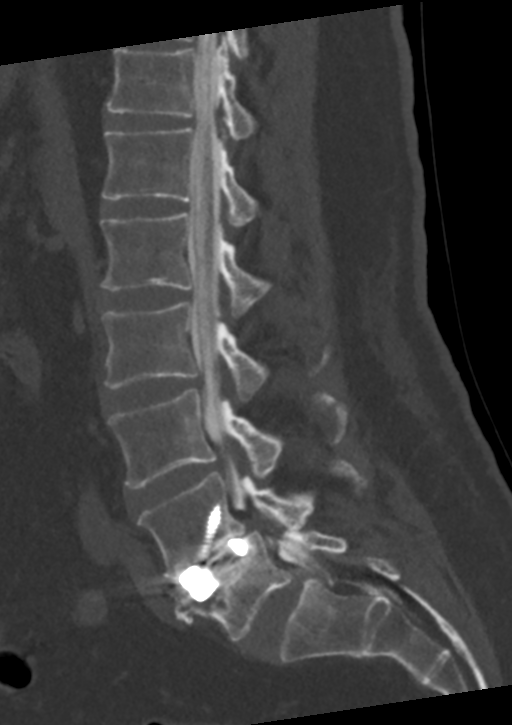
[im 36/61  bone]
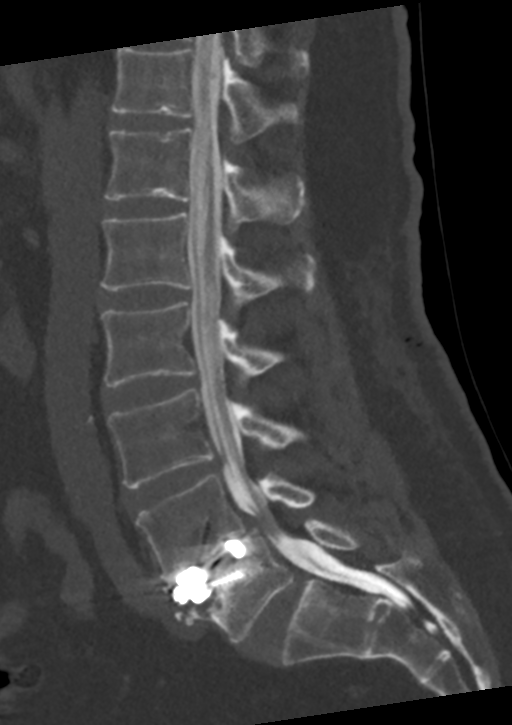
[im 41/61  bone]
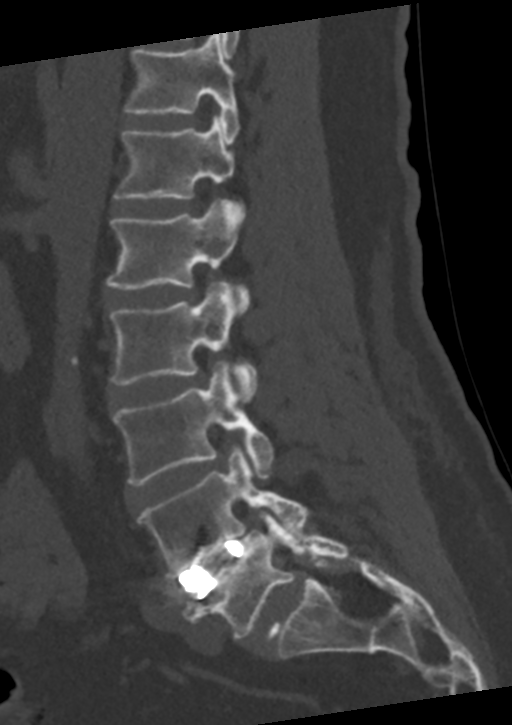

[11 of 33 positions shown; findings below may reference images not displayed]

FINDINGS: LUMBAR MYELOGRAM FINDINGS:

Thoracic and lumbar segmentation appears to be normal. Prior
anterior interbody fusion at L4-L5. Good intrathecal contrast
opacification. Some L5 endplate subsidence noted about the hardware
which appears intact. See CT findings for additional details at that
level.

On supine images there is only mild mass effect on the thecal sac at
L4-L5, no definite stenosis at L5-S1. Upright lateral images
demonstrate increased circumferential mass effect on the thecal sac
at L4-L5 suggesting mild to moderate spinal stenosis, and also
questionable increased mass effect at L5-S1. Borderline to mild
spinal stenosis at L3-L4.

With flexion and extension there is no definite abnormal motion,
although questionable increased motion about the interbody implant
at L4-L5 between the supine and upright views.

CT LUMBAR MYELOGRAM FINDINGS:

Stable lumbar lordosis since [REDACTED]. Subtle anterolisthesis of L5 on
S1 and retrolisthesis of L3 on L4.

Postoperative details are below. No acute osseous abnormality
identified. Intact visible sacrum and SI joints.

Evidence of right nephrolithiasis. But no hydronephrosis. Otherwise
negative visible noncontrast abdominal viscera. Lumbar paraspinal
soft tissues are within normal limits.

Good intrathecal contrast opacification. Normal myelographic
appearance of the lower thoracic spinal cord with conus medullaris
at T12-L1.

Negative visible lower thoracic levels through L1-L2.

L2-L3: Mild circumferential disc bulge. Eccentric to the left. No
spinal or lateral recess stenosis. Borderline to mild left L2
foraminal stenosis.

L3-L4: Circumferential disc bulge with mild endplate spurring. Mild
posterior element hypertrophy and epidural lipomatosis. Borderline
to mild spinal stenosis (series 4, image 88). No lateral recess
stenosis. Mild if any L3 foraminal stenosis. This level appears
stable.

L4-L5: Previous anterior interbody fusion. Hardware appears intact.
No definite cortical screw loosening. Mostly L5 superior and
anterior endplate subsidence. Endplate sclerosis and spurring.
However, there is no convincing interbody arthrodesis and suggestion
of trace interbody gas (sagittal image 34). No posterior element
ankylosis. Moderate posterior element hypertrophy. Mild epidural
lipomatosis. Residual posterior disc osteophyte complex combines for
moderate spinal stenosis (series 4, image 102 and series 10, image
111. There is mild to moderate bilateral L4 neural foraminal
stenosis which appears greater on the right. This level appears
stable since [DATE]-S1: Mild mostly far lateral disc bulging and endplate spurring.
Moderate to severe facet hypertrophy with bilateral vacuum facet. No
spinal or lateral recess stenosis. Moderate left and moderate to
severe right L5 neural foraminal stenosis (series 7, image 24 on the
right) appears not significantly changed since [REDACTED].
IMPRESSION: 1. Previous L4-L5 anterior interbody fusion. Some L5 endplate
subsidence. No definite hardware loosening. But no convincing
interbody arthrodesis - with suspicion of trace interbody vacuum
phenomena and greater than expected motion between the supine and
upright radiographs.
Residual L4-L5 moderate multifactorial spinal and right greater than
left neural foraminal stenosis.

2. L3-L4 multifactorial mild spinal stenosis.

3. L5-S1 disc and endplate degeneration primarily affecting the
foramina, with up to severe facet hypertrophy combining for moderate
to severe L5 neural foraminal stenosis greater on the right.

4. Right nephrolithiasis.

## 2022-04-23 IMAGING — RF DG LUMBAR SPINE 2-3V
1 series · 2 of 2 positions shown · non-contrast
Comparison: None.

CLINICAL DATA: Fluoroscopic assistance for lumbar spine surgery

EXAM:
LUMBAR SPINE - 2-3 VIEW

[Series 1: run · 2 of 2 slices shown]
[im 1/2]
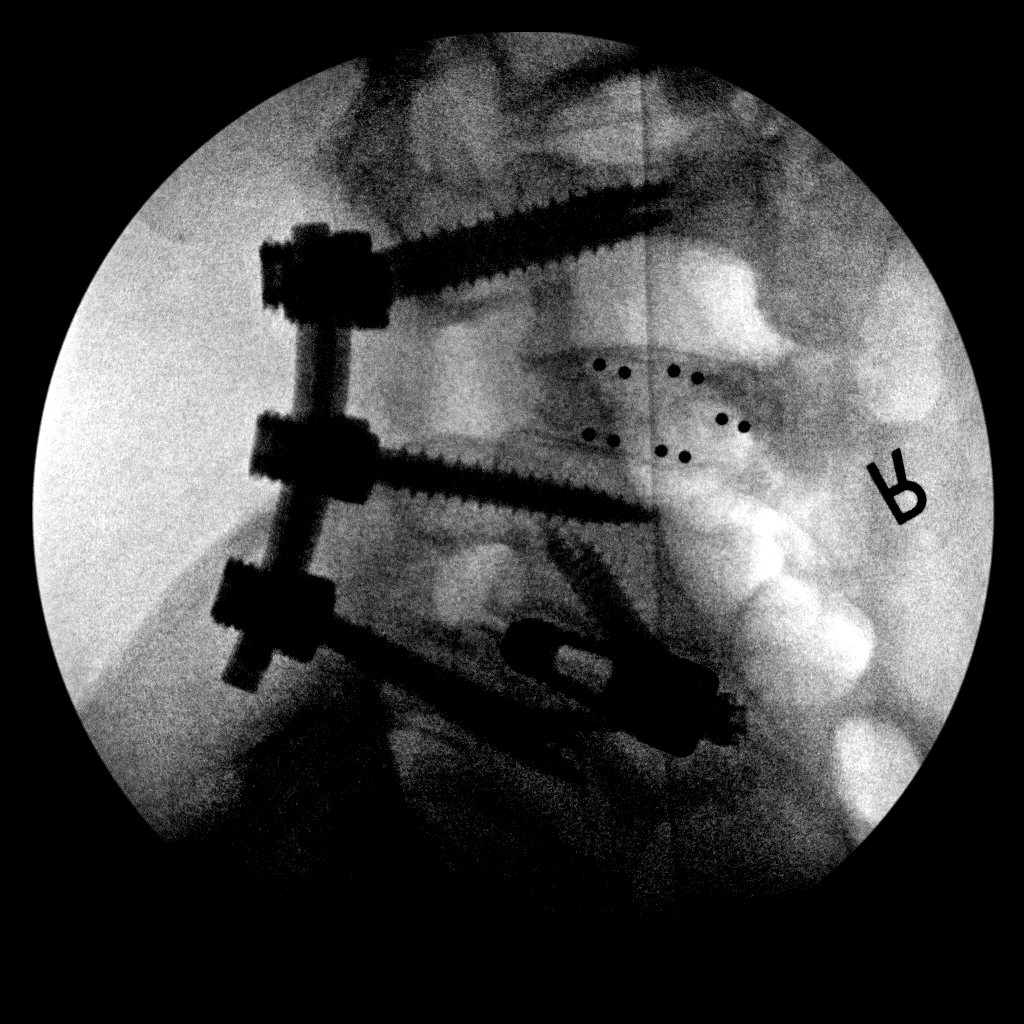
[im 2/2]
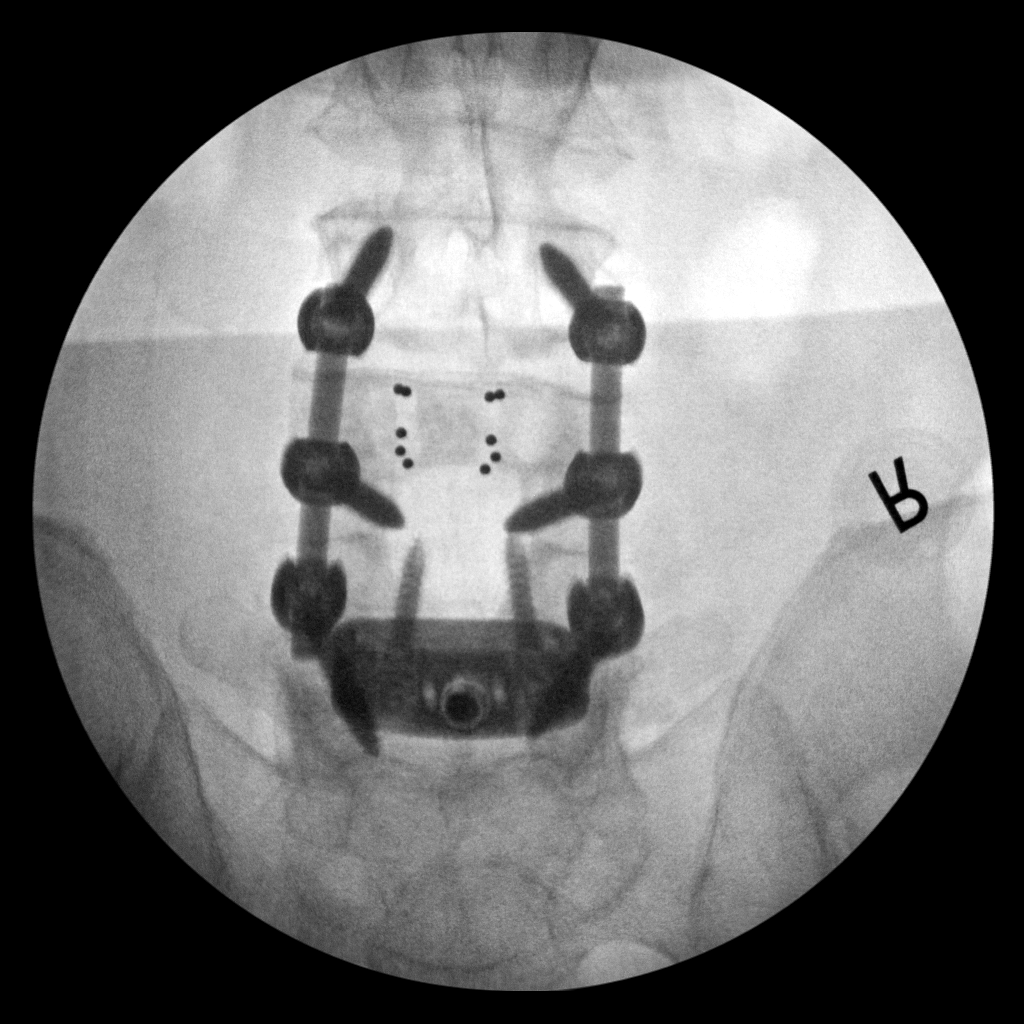

[2 of 2 positions shown; findings below may reference images not displayed]

FINDINGS: Fluoroscopic images show posterior surgical fusion at L3-L4 and
L4-L5 levels. There is previous anterior surgical fusion at L4-L5
level. Fluoroscopic time was 19.1 seconds. Radiation dose is
mGy.
IMPRESSION: Fluoroscopic assistance was provided for posterior lumbar spine
fusion at L3-L4 and L4-L5 levels.

## 2022-04-23 IMAGING — CR DG LUMBAR SPINE 2-3V
2 series · 2 of 2 positions shown · non-contrast
Comparison: 03/11/2021

CLINICAL DATA: Lateral view of lumbar spine for intraoperative
localization

EXAM:
LUMBAR SPINE - 2-3 VIEW

[lateral (1 of 2)]
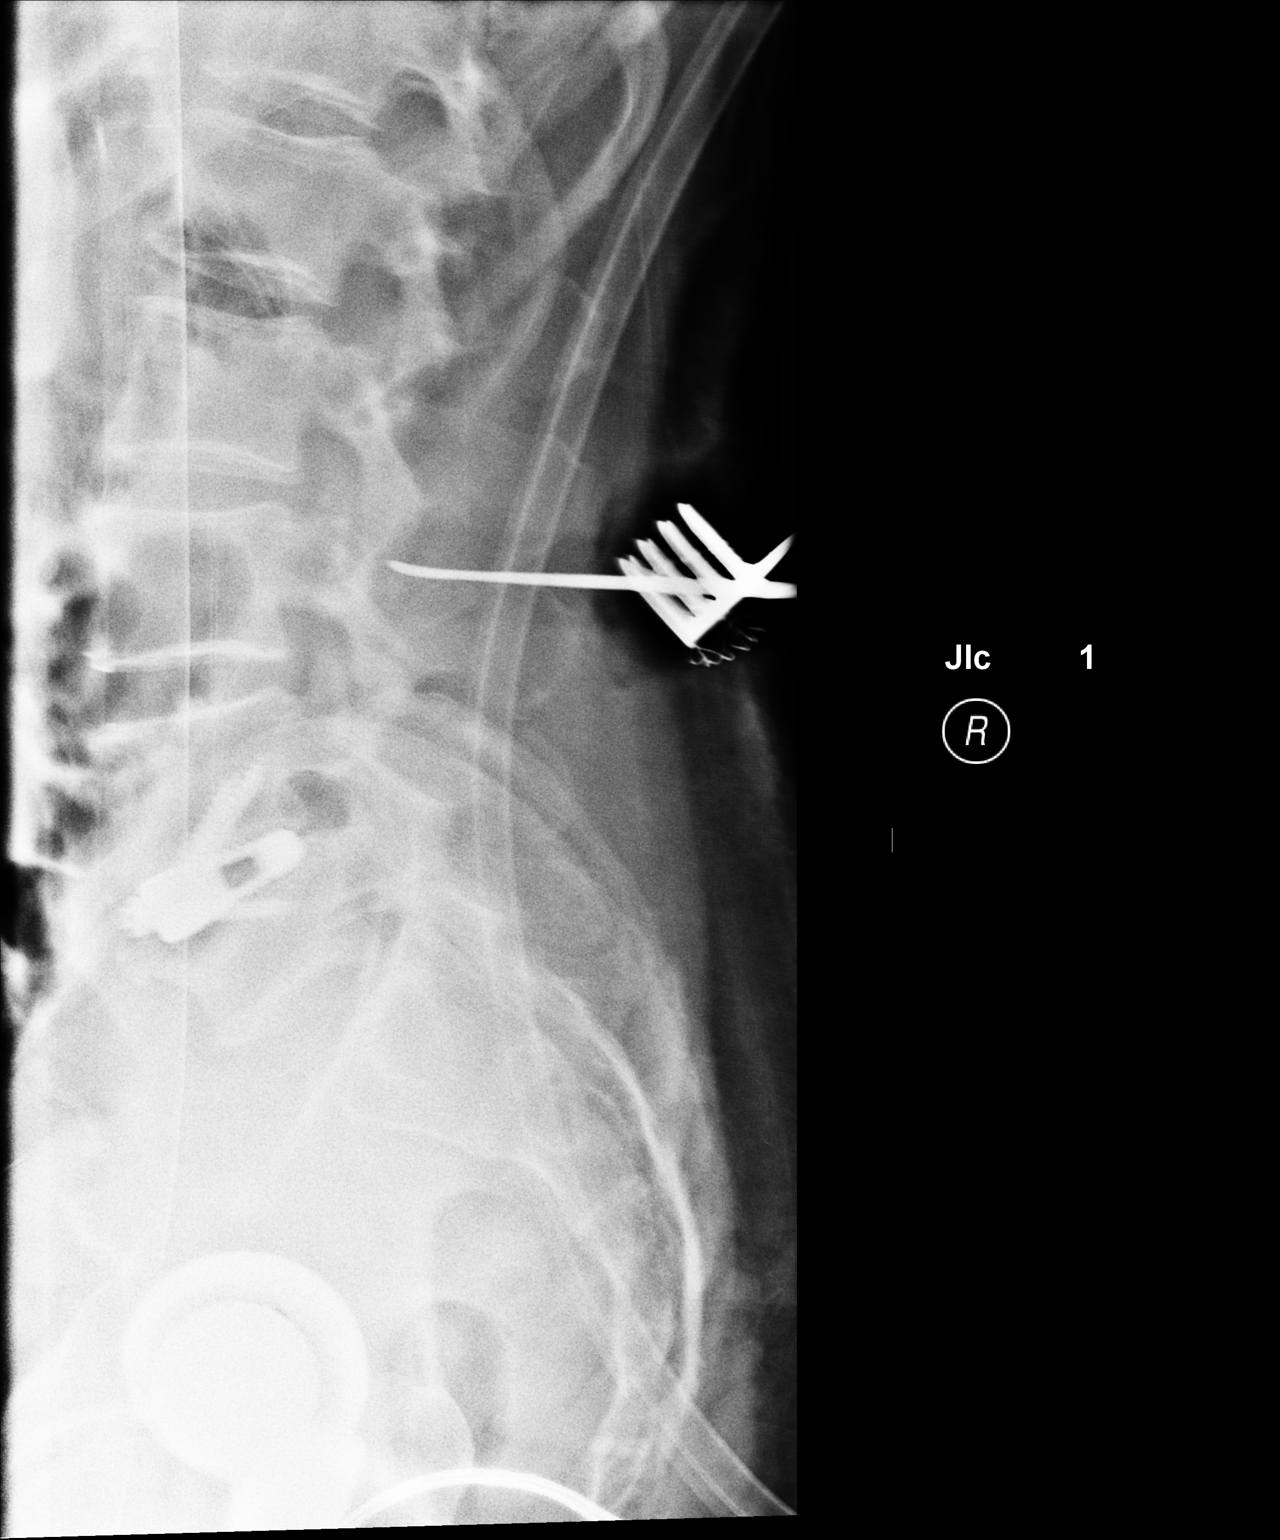

[lateral (2 of 2)]
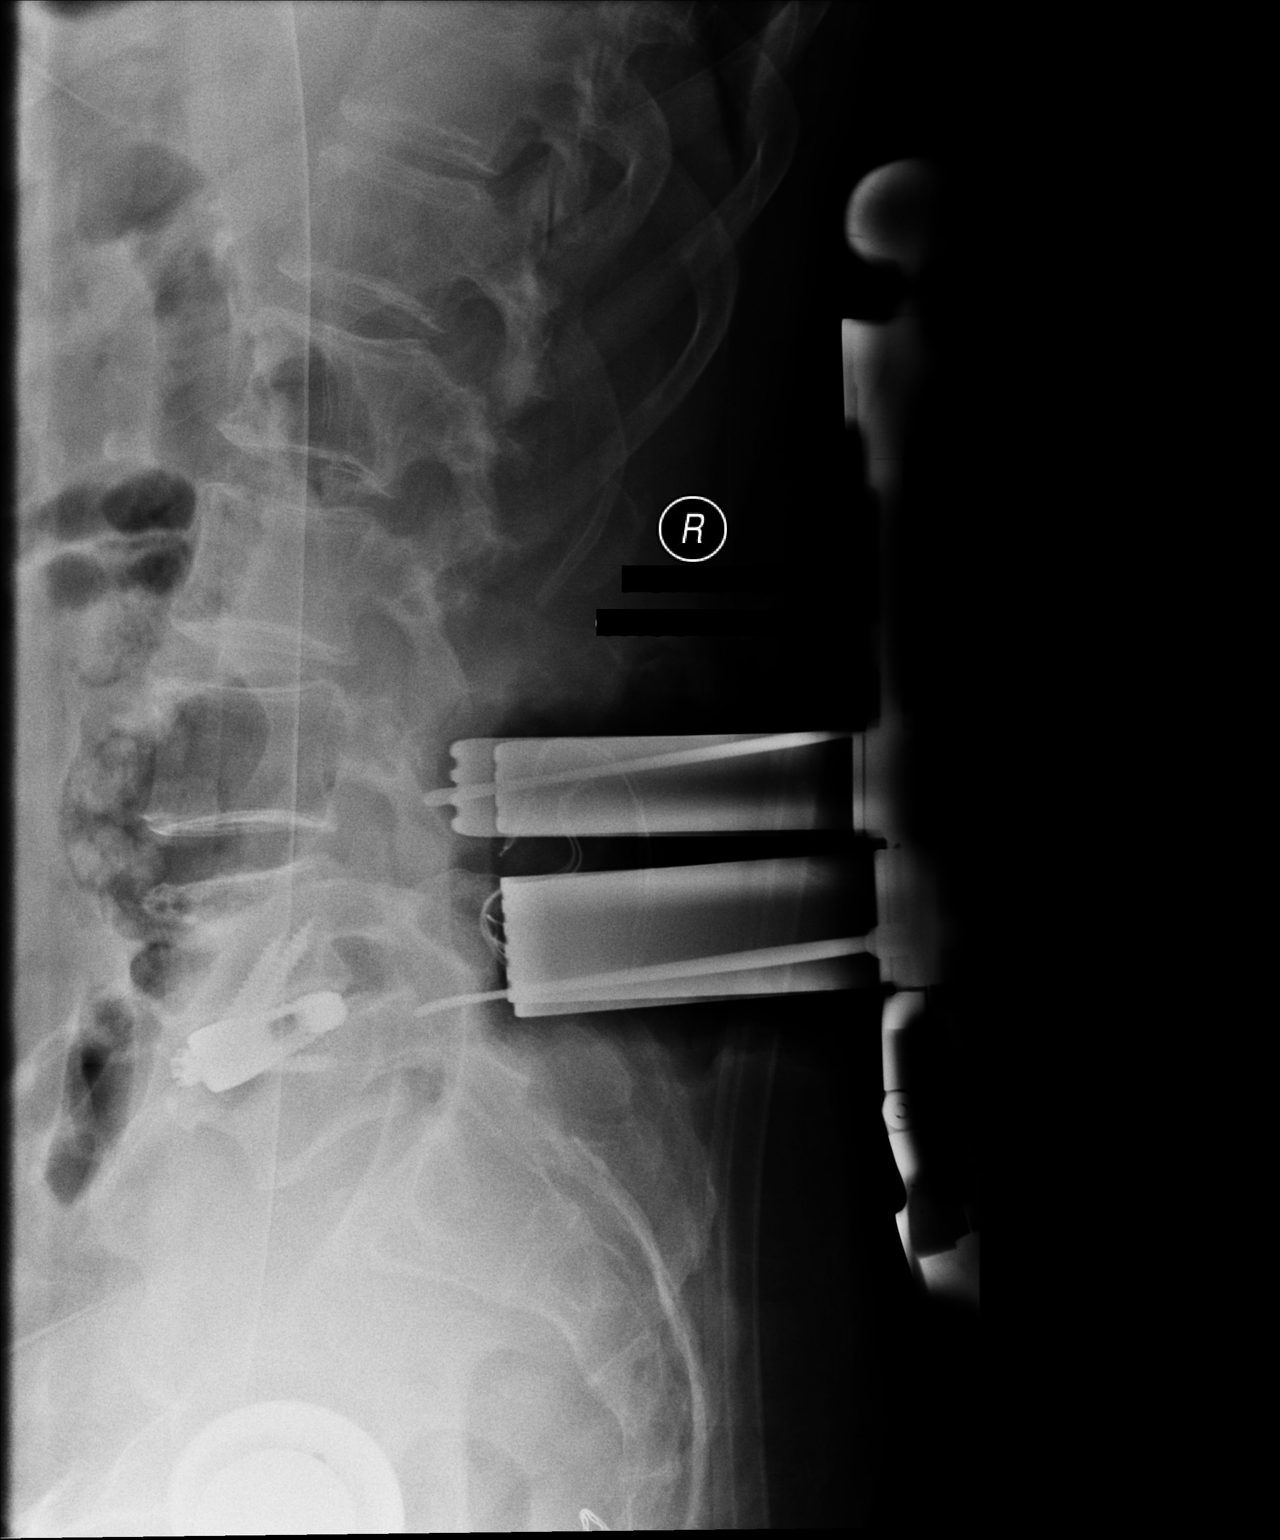

[2 of 2 positions shown; findings below may reference images not displayed]

FINDINGS: Portable cross-table lateral view of lumbar spine shows tip of
surgical probe superimposed over the posterior elements at L3 level.
There is previous anterior surgical fusion at L4-L5 level.
IMPRESSION: Portable cross-table lateral view of lumbar spine was for
intraoperative localization.

## 2022-08-20 ENCOUNTER — Other Ambulatory Visit: Payer: Self-pay | Admitting: Orthopedic Surgery

## 2022-08-20 DIAGNOSIS — M25551 Pain in right hip: Secondary | ICD-10-CM

## 2022-08-28 ENCOUNTER — Ambulatory Visit
Admission: RE | Admit: 2022-08-28 | Discharge: 2022-08-28 | Disposition: A | Discharge status: Home / Self Care | Payer: Medicare Other | Source: Ambulatory Visit | Attending: Orthopedic Surgery | Admitting: Orthopedic Surgery

## 2022-08-28 DIAGNOSIS — M25552 Pain in left hip: Secondary | ICD-10-CM
